# Patient Record
Sex: Female | Born: 1985 | Race: Black or African American | Hispanic: No | Marital: Single | Smoking: Current every day smoker
Health system: Southern US, Community
[De-identification: ages and names within clinical notes are randomized; demographics above are authoritative.]

## PROBLEM LIST (undated history)

## (undated) DIAGNOSIS — F1910 Other psychoactive substance abuse, uncomplicated: Secondary | ICD-10-CM

---
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
## 2006-04-24 DIAGNOSIS — F17200 Nicotine dependence, unspecified, uncomplicated: Secondary | ICD-10-CM

## 2006-05-29 ENCOUNTER — Emergency Department (HOSPITAL_COMMUNITY): Admission: EM | Admit: 2006-05-29 | Discharge: 2006-05-29 | Payer: Self-pay | Admitting: Emergency Medicine

## 2006-05-31 ENCOUNTER — Inpatient Hospital Stay (HOSPITAL_COMMUNITY): Admission: EM | Admit: 2006-05-31 | Discharge: 2006-06-01 | Payer: Self-pay | Admitting: Emergency Medicine

## 2006-06-03 ENCOUNTER — Inpatient Hospital Stay (HOSPITAL_COMMUNITY): Admission: AD | Admit: 2006-06-03 | Discharge: 2006-06-03 | Payer: Self-pay | Admitting: Obstetrics and Gynecology

## 2006-07-06 ENCOUNTER — Emergency Department (HOSPITAL_COMMUNITY): Admission: EM | Admit: 2006-07-06 | Discharge: 2006-07-06 | Payer: Self-pay | Admitting: Emergency Medicine

## 2006-07-27 ENCOUNTER — Emergency Department (HOSPITAL_COMMUNITY): Admission: EM | Admit: 2006-07-27 | Discharge: 2006-07-27 | Payer: Self-pay | Admitting: Family Medicine

## 2006-12-16 ENCOUNTER — Emergency Department (HOSPITAL_COMMUNITY): Admission: EM | Admit: 2006-12-16 | Discharge: 2006-12-16 | Payer: Self-pay | Admitting: Emergency Medicine

## 2006-12-24 ENCOUNTER — Encounter (INDEPENDENT_AMBULATORY_CARE_PROVIDER_SITE_OTHER): Payer: Self-pay | Admitting: Family Medicine

## 2007-04-17 ENCOUNTER — Emergency Department (HOSPITAL_COMMUNITY): Admission: EM | Admit: 2007-04-17 | Discharge: 2007-04-17 | Payer: Self-pay | Admitting: Family Medicine

## 2007-06-25 ENCOUNTER — Emergency Department (HOSPITAL_COMMUNITY): Admission: EM | Admit: 2007-06-25 | Discharge: 2007-06-25 | Payer: Self-pay | Admitting: Emergency Medicine

## 2008-09-12 ENCOUNTER — Emergency Department (HOSPITAL_COMMUNITY): Admission: EM | Admit: 2008-09-12 | Discharge: 2008-09-12 | Payer: Self-pay | Admitting: Emergency Medicine

## 2008-10-14 ENCOUNTER — Inpatient Hospital Stay (HOSPITAL_COMMUNITY): Admission: AD | Admit: 2008-10-14 | Discharge: 2008-10-14 | Payer: Self-pay | Admitting: Obstetrics & Gynecology

## 2008-10-26 ENCOUNTER — Ambulatory Visit: Payer: Self-pay | Admitting: Obstetrics and Gynecology

## 2008-10-26 ENCOUNTER — Ambulatory Visit (HOSPITAL_COMMUNITY): Admission: RE | Admit: 2008-10-26 | Discharge: 2008-10-26 | Payer: Self-pay | Admitting: Obstetrics & Gynecology

## 2010-06-01 LAB — POCT URINALYSIS DIP (DEVICE)
Bilirubin Urine: NEGATIVE
Nitrite: NEGATIVE
Protein, ur: NEGATIVE mg/dL
Urobilinogen, UA: 0.2 mg/dL (ref 0.0–1.0)

## 2010-06-02 LAB — CBC
HCT: 34.8 % — ABNORMAL LOW (ref 36.0–46.0)
Hemoglobin: 11.6 g/dL — ABNORMAL LOW (ref 12.0–15.0)
MCHC: 33.3 g/dL (ref 30.0–36.0)
MCV: 94.4 fL (ref 78.0–100.0)
Platelets: 359 10*3/uL (ref 150–400)
RBC: 3.69 MIL/uL — ABNORMAL LOW (ref 3.87–5.11)

## 2010-06-02 LAB — URINALYSIS, ROUTINE W REFLEX MICROSCOPIC
Bilirubin Urine: NEGATIVE
Leukocytes, UA: NEGATIVE
Nitrite: NEGATIVE
Protein, ur: NEGATIVE mg/dL
Urobilinogen, UA: 0.2 mg/dL (ref 0.0–1.0)
pH: 6 (ref 5.0–8.0)

## 2010-06-02 LAB — URINE MICROSCOPIC-ADD ON

## 2010-06-02 LAB — ABO/RH: ABO/RH(D): B POS

## 2010-06-03 LAB — RPR: RPR Ser Ql: NONREACTIVE

## 2010-06-03 LAB — POCT I-STAT, CHEM 8
HCT: 35 % — ABNORMAL LOW (ref 36.0–46.0)
Hemoglobin: 11.9 g/dL — ABNORMAL LOW (ref 12.0–15.0)
Potassium: 3.4 mEq/L — ABNORMAL LOW (ref 3.5–5.1)
Sodium: 138 mEq/L (ref 135–145)
TCO2: 26 mmol/L (ref 0–100)

## 2010-06-03 LAB — WET PREP, GENITAL
Trich, Wet Prep: NONE SEEN
Yeast Wet Prep HPF POC: NONE SEEN

## 2010-06-03 LAB — URINE MICROSCOPIC-ADD ON

## 2010-06-03 LAB — CBC
HCT: 34.2 % — ABNORMAL LOW (ref 36.0–46.0)
MCHC: 34 g/dL (ref 30.0–36.0)
MCV: 92.8 fL (ref 78.0–100.0)
Platelets: 359 10*3/uL (ref 150–400)
RBC: 3.69 MIL/uL — ABNORMAL LOW (ref 3.87–5.11)
RDW: 13.4 % (ref 11.5–15.5)
WBC: 11.2 10*3/uL — ABNORMAL HIGH (ref 4.0–10.5)

## 2010-06-03 LAB — URINALYSIS, ROUTINE W REFLEX MICROSCOPIC
Glucose, UA: NEGATIVE mg/dL
Ketones, ur: 15 mg/dL — AB
Leukocytes, UA: NEGATIVE
Nitrite: NEGATIVE
Urobilinogen, UA: 1 mg/dL (ref 0.0–1.0)
pH: 6 (ref 5.0–8.0)

## 2010-06-03 LAB — ABO/RH: ABO/RH(D): B POS

## 2010-06-03 LAB — DIFFERENTIAL
Lymphs Abs: 1.6 10*3/uL (ref 0.7–4.0)
Monocytes Relative: 9 % (ref 3–12)

## 2010-07-10 NOTE — Group Therapy Note (Signed)
NAMESOLE, LENGACHER           ACCOUNT NO.:  [id number]   MEDICAL RECORD NO.:  [id number]          PATIENT TYPE:  OUT   LOCATION:  [facility]                    [facility]   PHYSICIAN:  Elsie Lincoln, MD      DATE OF BIRTH:  1985/09/26   DATE OF SERVICE:                                  CLINIC NOTE   HISTORY OF PRESENT ILLNESS:  The patient is a 25 year old G64, P3-0-1-3  female, who was seen in the ER on October 14, 2008, with right ectopic  pregnancy that was resolving.  The patient also has chronic PID or  endometriosis.  She was scheduled today for surgery by Dr. Marice Potter and Dr.  Shawnie Pons.  The patient got confused and came to clinic, this was discovered  too late and the surgery was cancelled.  The patient was sent to clinic,  sent for ultrasound, she does still have a right TOA versus  endometrioma.  She has a left simple hydrosalpinx and some fluid in the  uterus.  She is out of pain today and her UPT is negative.  I offered  the patient surgery on September 27, but she stated that she cannot come  that day and probably never come because she cannot find a babysitter.  I told her that this up to her what she has surgery or not given that  her ectopic pregnancy has resolved, that she no longer has an emergency  to need surgery and it is up to her whether she wants surgery for pain.  The patient states she is out of all pains, so she will hold off on  surgery.  I warned her that she is at risk for ectopic pregnancy in the  future given the damage to her tubes.  She is currently on Depo-Provera.  I encouraged her to continue Depo-Provera and she is due for another  Depo shot 12 weeks after her last injection.  Her last injection was  October 14, 2008.   PAST MEDICAL HISTORY:  Denies all major medical problems.   PAST SURGICAL HISTORY:  Cesarean section.   OBSTETRICAL HISTORY:  Three cesarean sections and 1 ectopic pregnancy.   GYN HISTORY:  As above.  No history of abnormal Pap  smear.   MEDICATIONS:  None.   ALLERGIES:  None.  No latex allergy.   FAMILY HISTORY:  No diabetes, heart disease, blood clots, or familial  cancers.   SOCIAL HISTORY:  She does smoke, does not drink alcohol, does not have a  history of physical abuse or addicted to alcohol.   REVIEW OF SYSTEMS:  Positive for recent shortness of breath, but not  today.   PHYSICAL EXAMINATION:  GENERAL:  Not done today.  This is basically a  consult visit on surgery and contraception.   ASSESSMENT AND PLAN:  44. A 25 year old female, G4, para 3-0-1-3 with resolved ectopic      pregnancy with prolonged discussion on Depo-Provera.  2. Ovarian abscess and need for surgery whenever she thinks that the      pain is too much for her to handle as indicated in the above  discussion.  The patient needs to continue her routine health care      maintenance.           ______________________________  Elsie Lincoln, MD     KL/MEDQ  D:  10/26/2008  T:  10/27/2008  Job:  [id number]

## 2010-07-13 NOTE — Discharge Summary (Signed)
NAMEKAEYA, SCHIFFER           ACCOUNT NO.:  [id number]   MEDICAL RECORD NO.:  [id number]          PATIENT TYPE:  INP   LOCATION:  9108                          [facility]ILITY[facility]  [facility]   PHYSICIAN:  Tanya S. Shawnie Pons, M.D.   DATE OF BIRTH:  1985/11/26   DATE OF ADMISSION:  07/14/2004  DATE OF DISCHARGE:  07/16/2004                                 DISCHARGE SUMMARY   HOSPITAL COURSE:  This is an 24 year old G3 P3-0-0-3 presented at 12 and 2  weeks in labor. Went on to have repeat C-section with no complications. Had  previously signed for BTL but was explained to her that this could not be  performed secondary to her age. Delivery was under spinal anesthesia. Baby  boy, three-vessel cord, manual placental removal. Estimated blood loss  approximately 400 mL. Apgars 8 and 9. Mom plans to bottle feed and received  Depo at discharge and will get IUD at follow-up visit. Mom does not want  circumcision secondary to cost. Social work consult ordered for questionable  bonding issues. GBS positive on May 25, 2004. Blood type B positive,  antibody screen negative, rubella immune. Discharged home on Percocet 5/325  mg p.o. q.4h. p.r.n. pain. Follow up in 6 weeks at [facility]. Discharge  hemoglobin 8.9.       MBV/MEDQ  D:  08/10/2004  T:  08/10/2004  Job:  [id number]

## 2010-07-13 NOTE — Op Note (Signed)
   NAME:  [name], [name]                     ACCOUNT NO.:  [id number]   MEDICAL RECORD NO.:  [id number]                   PATIENT TYPE:  MAT   LOCATION:  [facility]                                 [facility]   PHYSICIAN:  Phil D. Okey Dupre, M.D.                  DATE OF BIRTH:  [birth year]   DATE OF PROCEDURE:  04/07/2002  DATE OF DISCHARGE:  03/30/2002                                 OPERATIVE REPORT   PREOPERATIVE DIAGNOSIS:  Repeat cesarean section.   POSTOPERATIVE DIAGNOSIS:  Repeat cesarean section.   PROCEDURE:  Low flap cesarean section.   SURGEON:  Javier Glazier. Okey Dupre, M.D.   ASSISTANT:  Medical student.   FINDINGS:  Small 5 pound 4 ounce female infant, Apgars 9 and 9 with a  hyperspiral umbilical cord and a somewhat adherent placenta.   DESCRIPTION OF PROCEDURE:  Under satisfactory spinal anesthesia with the  patient in the dorsal supine position, Foley catheter in the urinary  bladder, the abdomen was prepped and draped in the usual a sterile manner.  The abdomen was entered through a previous transverse incision scar removing  the keloid scar on entry.  On entering the peritoneal cavity and the  anterior surface of the uterus was opened transversely by sharp dissection  and the bladder pushed away from the lower uterine segment which was entered  by sharp and blunt dissection and from an ROT presentation the baby was  delivered.  The cord was doubly clamped and divided and the baby handed to  the pediatrician.  Sample of blood was taken from the cord and the placenta  was manually removed with some difficulty because of its adherence to the  fundus of the uterus.  The uterus was then explored and closed with a  continuous running locked 0 Vicryl on an atraumatic needle.  No bleeding was  noted.  Ovaries and tubes were found to be normal.  The fascia was closed  from either incision with a continuous alternating locked 0 Vicryl on an  atraumatic needle.  Subcutaneous bleeders were  controlled with hot cautery,  skin staples for skin edge approximation.  Dry sterile dressings applied.  Tape, instrument and sponge counts were reported as correct at the end of  the procedure.  Total blood loss 400 mL during the procedure.  The patient  tolerated the procedure well and was transferred to recovery in satisfactory  condition.                                               Phil D. Okey Dupre, M.D.    PDR/MEDQ  D:  04/07/2002  T:  04/07/2002  Job:  [id number]

## 2010-07-13 NOTE — Discharge Summary (Signed)
NAMEBRENDI, [name]                     ACCOUNT NO.:  [id number]   MEDICAL RECORD NO.:  [id number]                   PATIENT TYPE:  INP   LOCATION:  9175                                 [facility]   PHYSICIAN:  Nilda Simmer, M.D.                  DATE OF BIRTH:  Jul 20, 1985   DATE OF ADMISSION:  02/09/2002  DATE OF DISCHARGE:  02/11/2002                                 DISCHARGE SUMMARY   DATE OF BIRTH:  07-Apr-1985.   PROCEDURE:  Complete OB ultrasound.   DISCHARGE DIAGNOSES:  1. Intrauterine pregnancy at 5 and 1/7th weeks gestation.  2. Preterm contractions.  3. GBS positive status.  4. Hyperglycemia secondary to steroids.  5. Depression.  6. TH pregnancy.   DISCHARGE MEDICATIONS:  1. Prenatal vitamins one by mouth each day.  2. Lexapro 10 mg by mouth each day.   FOLLOW UP:  The patient is to call Dr. Ophelia Charter' office at [phone] for an  appointment in the upcoming one to two weeks.   HOSPITAL COURSE:  [name] is a 25 year old Gravida II, Para I, 0/0/1  initially presenting at 69 and 6/7th weeks gestation secondary to a one day  history of abdominal cramping suggestive of contractions. Upon evaluation,  the patient was found to be contracting approximately every 2-5 minutes.  However, cervical examination was without change. Wet prep, gonorrhea,  Chlamydia, GBS, and urinalysis as well as FFN testing were performed during  hospitalization. Of note, wet prep was within normal limits. GC and  Chlamydia were negative. The patient is GBS positive and the patient's FFN  was negative during hospitalization. Urinalysis was within normal limits.  The patient was admitted secondary to preterm contractions and initiated on  IV fluids, bedrest, Unasyn therapy, for presenting GBS status. Frequency of  contractions decreased significantly on hospital day three. The patient's  cervical examination had not changed and was following finger tip long high  midline. The  patient was in agreement to be placed on bedrest as an  outpatient and to follow-up closely with her primary care physician, Dr.  Jonah Blue.   HOSPITAL PROBLEM LIST:  1. Preterm contractions. The patient was admitted for three days and     received Unasyn, IV fluids, and bedrest. FFN was negative. GBS positive.     Wet prep GC Chlamydia was negative. The patient did receive beta     Methasone times two doses during her hospitalization.  2. Hyperglycemia secondary to steroid injections. The patient was covered     with sliding scale insulin during her hospitalization. I will not send     the patient home on insulin. However, will have her follow-up closely     with her primary care physician, Dr. Ophelia Charter, for a Glucola or three hour     Glucola testing and determination if she may need that modification     regimen.  3. Depression. Evaluated during  hospitalization by her primary care     physician. Lexapro therapy was initiated during hospitalization. Will     discharge the patient. The patient denies suicidal ideations throughout     hospitalization.  4. Intrauterine pregnancy at 47 and 1/7th weeks gestation. Fetal heart tones     were reassuring during hospitalization. An ultrasound was performed which     revealed a single fetus, cephalic, posterior placenta, no previa, grade I     placenta. Normal amniotic fluid at 15.5. Estimated fetal weight was 50-     75% for 31 weeks. Female sex. Cervix was 3.0 cm.   DISCHARGE LABORATORY DATA:  Urinalysis negative. Group B strep positive. GBS  positive. Wet prep revealed moderate WBC's, moderate bacteria, no trich, no  yeast, and no clue cells. GC negative. Chlamydia negative.   DISCHARGE INSTRUCTIONS:  Preterm labor precautions were reviewed in detail  with the patient. The patient will be placed on bedrest. Up only to eat and  shower. The patient is to avoid any sexual activity or anything that may  cause an orgasm.                                                 Nilda Simmer, M.D.    KS/MEDQ  D:  02/11/2002  T:  02/12/2002  Job:  [id number]   cc:   Conni Elliot, M.D.  [address]  [place]  [place] [address]  Fax: [phone]   Jonah Blue, M.D.  [facility] - [address]  [place], [place] [address]  Fax: [phone]

## 2010-07-13 NOTE — Discharge Summary (Signed)
[name], [name] NO.:  [id number]   MEDICAL RECORD NO.:  [id number]          PATIENT TYPE:  INP   LOCATION:  5735                         FACILITY:  [facility]   PHYSICIAN:  Drue Dun, M.D.       DATE OF BIRTH:  04-30-1985   DATE OF ADMISSION:  05/31/2006  DATE OF DISCHARGE:  06/01/2006                               DISCHARGE SUMMARY   PRIMARY CARE PHYSICIAN:  Henri Medal, M.D. at [facility].   NOTE:  The patient left against medical advice.   DISCHARGE DIAGNOSES:  1. Pelvic inflammatory disease with tubo-ovarian abscess.  2. Tobacco abuse.  3. Status post lower transverse cesarean section times three.   DISCHARGE MEDICATIONS:  1. Doxycycline 100 mg p.o. b.i.d. times 14 days, prescription      provided.  2. Flagyl 500 mg p.o. b.i.d. times 14 days, prescription provided.  3. Ciprofloxacin 500 mg p.o. b.i.d. times 14 days, prescription      provided.  4. Vicodin 5/500 mg 1 to 2 tablets every 4 to 6 hours as needed for      pain, dispensed #15, no refills, prescription provided.   PERTINENT LABORATORY DATA:  CBC on admission showed a white blood cell  count of 15.6, hemoglobin of 12, hematocrit of 36.3, and platelets of  345.  Differential showed 81% neutrophils and absolute neutrophil count  of 12.6.  urine pregnancy test is negative on admission.  Urinalysis and  micro were negative.  Gonorrhea and chlamydia cultures obtained on May 29, 2006 were both positive.  RPR obtained on May 29, 2006 was  nonreactive.  Wet prep obtained on May 29, 2006 showed no yeast,  Trichomonas but did show few clue cells and moderate WBCs.  Complete  metabolic panel on admission showed sodium of 138, potassium of 3.4,  chloride of 105, bicarb of 23, glucose of 78, BUN of 6, creatinine of  0.75, total bilirubin of 0.8, alkaline phosphatase of 52, AST of 11, ALT  of 11, total protein of 6.6, albumin of 3.2, and calcium of 8.8.  Lipase  was  normal on admission at 18.  Hepatitis B surface antigen was obtained  and is negative.  Hepatitis C antibody is pending at the time of  discharge.  On the morning of discharge against medical advice, the  patient's white blood cell count remained elevated at 15.6.  Basic  metabolic panel revealed normal electrolytes.  The patient continued to  be febrile prior to discharge with a T-max of 101.   BRIEF HOSPITAL COURSE:  Please see full dictated history and physical  for full details on initial presentation and workup.  In brief, this is  a 3oyo African-American female, G3, P3, 0, 0, 0, with no significant  past medical history who presents with abdominal pain and physical exam  findings consistent with pelvic inflammatory disease and dehydration.  Ultrasound revealed evidence of tubo-ovarian abscess greatest on the  left measuring 3 x 2 x 3 cm.   PROBLEM LIST:  1. Pelvic inflammatory disease and tubo-ovarian abscess:  The patient  was given 1 gm IV of ceftriaxone in the emergency department and      initiated on doxycycline and Flagyl IV.  She was experiencing      nausea and vomiting upon admission, thus antibiotics were IV and      planned to be continued IV for 24 hours given concomitant tubo-      ovarian abscess.  On the morning of discharge against medical      advice, the patient's white blood cell count remains significantly      elevated and patient continues to have fever.  Recommended that      patient remain in the hospital for continued IV antibiotics and      pain control.  The patient is unable to find care for her children      thus desires to leave against medical advice.  The risks of abscess      rupture and permanent infertility were discussed with the patient      as possible complications of leaving against medical advice.  The      patient was fully understanding of these complications and the      importance and severity of her condition.  The patient was  provided      prescriptions for doxycycline, Flagyl and ciprofloxacin orally for      a two week supply upon discharge against medical advice.  The      patient was still in significant degree of pain on the morning of      discharge as well.  2. Nausea, vomiting, and dehydration:  The patient was given 1.5 liter      normal saline IV fluid bolus in the ED.  The patient was then      placed on normal saline with 20 mEq of KCl per liter at 150 mL per      hour.  The patient was tolerating some minimal p.o. at the time of      discharge.  3. Tobacco abuse:  Smoking cessation consult was ordered however was      not obtained prior to discharge against medical advice.  4. Disposition:  The patient was discharged against medical advice      given the fact that she had no childcare for her children.  Social      Work was attempted to be contacted however the patient was not      willing to wait.  The patient was advised of the need for her      partner to be treated for gonorrhea and chlamydia as well.  The      patient was provided with prescriptions for a 14 day course of oral      doxycycline, Flagyl, and ciprofloxacin prior to discharge.  The      patient agrees to try to return to the hospital tonight if she is      able to arrange childcare for her children.  She was advised to      return to the hospital immediate if her pain is worsened or she is      unable to keep down her oral antibiotics.  The patient expressed      full understanding of the importance and severity of her condition      as well as the possible complications of discharge against medical      advice.   PROCEDURES:  The patient had pelvic ultrasound which showed concern for  possible bilateral tubo-ovarian abscess  greatest on the left measuring 3  x 2 x 3 cm.   DISCHARGE INSTRUCTIONS:  The patient has no dietary restrictions or wound care applicable.  The patient is to increase activity slowly.  The  patient is to  call the [facility] for a followup appointment  with Dr. Ludwig Clarks as soon as possible next week if she is unable to  return to the hospital.  The patient was provided with this phone  number.   The patient was discharged home against medical advice.           ______________________________  Drue Dun, M.D.     EE/MEDQ  D:  06/01/2006  T:  06/01/2006  Job:  [id number]   cc:   Henri Medal, MD

## 2010-07-13 NOTE — H&P (Signed)
NAMEMARICIA, [name]           ACCOUNT NO.:  [id number]   MEDICAL RECORD NO.:  [id number]          PATIENT TYPE:  INP   LOCATION:  5735                         FACILITY:  [facility]   PHYSICIAN:  Santiago Bumpers. Hensel, M.D.DATE OF BIRTH:  May 07, 1985   DATE OF ADMISSION:  05/31/2006  DATE OF DISCHARGE:  06/01/2006                              HISTORY & PHYSICAL   PRIMARY CARE PHYSICIAN:  Dr. Jackalyn Lombard at [facility].   CHIEF COMPLAINT:  Abdominal pain.   HISTORY OF PRESENT ILLNESS:  This is a 25 year old African-American  female G3, P 3-0-0-3 who presents with a 1-week history of progressively  worsening left-sided abdominal pain.  She describes the pain as being a  10/10 and only minimally relieved with p.o. Percocet at home.  The  patient reports that she was seen in the ED on May 29, 2006, for her  pain and at that time had a pelvic exam, had GC/Chlamydia and wet prep  obtained as well as an RPR.  These results have returned and revealed  GC/Chlamydia positive and wet prep positive for clue cells.  Since May 29, 2006, the patient's pain has continued to get worse and the patient  has vomited four times today on the day of admission.  She has not been  able to eat since last night.  She reports subjective fever and chills.  She denies any change in vaginal discharge or odor.  She currently  reports using condoms for contraception but is not using any other  medications or contraceptive devices.   REVIEW OF SYSTEMS:  The patient reports subjective fever and chills,  denies malaise, does report decreased appetite today.  CARDIOVASCULAR:  The patient reports chest pain this morning in the left upper quadrant  region starting around 9 a.m. associated with shortness of breath as it  was painful to breathe secondary to her left-sided pain.  The pain was  not radiating and not associated with nausea, vomiting or diaphoresis.  The pain resolved when the  patient arrived in the ED and received  morphine.  PULMONARY: Denies cough, sore throat or rhinorrhea.  GASTROINTESTINAL: Last vomiting was 2 days ago, reports occasional  heartburn, emesis as per HPI.  Denies diarrhea.  Emesis is nonbloody and  nonbilious.  GENITOURINARY: Denies dysuria or hematuria.  SKIN: Denies  rash.  Remainder of review of systems is unremarkable.   PAST MEDICAL HISTORY:  The patient is G3, P 3-0-0-3 status post lower  transverse cesarean section x3.  The patient has not had any other  surgeries.  The patient has a history of tobacco abuse.  The patient has  no known other medical problems.  The patient denies any previous  histories of sexually transmitted diseases or pelvic inflammatory  disease.   MEDICATIONS:  None.   ALLERGIES:  NO KNOWN DRUG ALLERGIES.   FAMILY HISTORY:  Is noncontributory.   SOCIAL HISTORY:  The patient currently lives with her mother and three  children.  Children have different fathers who are not involved.  The  patient dropped out of high school secondary to pregnancy.  The patient  smokes one pack per day but denies alcohol or drug use.  The patient  uses only condoms for contraception.   PHYSICAL EXAMINATION:  VITAL SIGNS:  Temperature is 99.5, heart rate  122, respiratory rate 22, blood pressure 106/72, pulse ox 99% on room  air.  GENERAL APPEARANCE:  The patient is awake, alert and mildly ill  appearing.  MENTAL STATUS:  Alert and oriented x3.  HEENT:  Head is normocephalic and atraumatic.  Pupils are equal, round,  reactive to light and accommodation.  Conjunctivae and lids are clear  bilaterally.  Tympanic membranes are clear bilaterally.  The patient has  moist mucous membranes status post 1.5 liters IV fluid bolus and  oropharynx is nonerythematous and without exudates.  CHEST:  Chest has symmetric movement bilaterally.  Chest pain is  reproducible on palpation of left upper quadrant and costophrenic  margin.  Lungs are  clear to auscultation bilaterally with normal work of  breathing.  Heart is regular rate and rhythm.  ABDOMEN:  Has is normoactive bowel sounds, is soft but exquisitely  tender to palpation over the suprapubic region and left upper and lower  quadrants.  No rebound or involuntary guarding.  EXTREMITIES:  No clubbing, cyanosis or edema.  Pulses are 2+ in the  dorsalis pedis pulses bilaterally.  GENITAL EXAM:  Positive cervical motion tenderness per EDP.  NEUROLOGICAL:  Cranial nerves II-XII are grossly intact.  The patient  has 5/5 strength bilaterally in all four extremities and 2+ symmetric  DTRs bilaterally.  SKIN:  Is without rash or lesion.  LYMPHATICS:  The patient has no cervical, supraclavicular or inguinal  lymphadenopathy.   LABORATORY DATA:  CBC reveals an elevated white count of 15.6,  hemoglobin 12.0, hematocrit 36.3, platelets of 345 with differential of  81% neutrophils and an absolute neutrophil count of 12.6.  Urine  pregnancy test is negative.  Urinalysis reveals specific gravity of  1.034, small bilirubin, greater than 80 ketones, 30 of protein, negative  nitrates and small leukocyte esterase; however, micro shows only 3-6  white blood cells and rare bacteria.  GC and Chlamydia cultures from  May 29, 2006, are both positive.  RPR was negative at this time.  A wet  prep from May 29, 2006, was negative for yeast or Trichomonas but  showed few clue cells and moderate white blood cells.   ASSESSMENT AND PLAN:  This is a 25 year old African-American female G3,  P 3-0-0-3 with no significant past medical history who presents with  pelvic inflammatory disease and dehydration.   Problem #1. PELVIC INFLAMMATORY DISEASE:  The patient is currently  afebrile but white blood cell count is significantly elevated and the  patient has classic signs and symptoms as well as positive gonorrhea and  Chlamydia cultures from 2 days ago.  The patient's degree of exquisite tenderness to  palpation on exam is concerning for possible tubo-ovarian  abscess.  The patient is status post ceftriaxone 1 gram IV in the ED.  We will start doxycycline 100 mg IV b.i.d. as well as Flagyl 500 mg IV  b.i.d. given presence of clue cells on wet prep and to cover anaerobes  and gram negatives.  We will continue IV antibiotics while the patient  has nausea and vomiting and is unable to tolerate p.o.  We will change  to p.o. antibiotics once tolerating.  We will also obtain pelvic  ultrasound to rule out tubo-ovarian abscess.  We will check hepatitis B  surface antigen,  hepatitis C antibody as well as HIV to complete STD  workup.  RPR was previously negative 2 days ago thus no need to repeat.  Urine pregnancy test was negative.  We will give morphine IV as needed  overnight for abdominal pain.   Problem 2. NAUSEA, VOMITING AND DEHYDRATION:  The patient is status post  1.5 liters IV fluid bolus in the ED.  We will continue normal saline at  150 mL per hour and give clear liquids p.o. and advance as tolerated.  We will check CMP given vomiting.  We will also give Phenergan 12.5 mg  IV q.6 as needed for symptomatic relief.   Problem 3. FLUIDS, ELECTROLYTES, NUTRITION/GASTROENTEROLOGY:  Per number  2.  We will start with clears as tolerated and advance to regular diet  as tolerated.  CMP pending to check electrolytes.   Problem 4. TOBACCO ABUSE:  We will write for smoking cessation consult.   Problem 5. DISPOSITION:  Pending toleration of p.o. antibiotics.  Advised patient that her partner will need treatment as well.     ______________________________  Drue Dun, M.D.    ______________________________  Santiago Bumpers. Leveda Anna, M.D.    EE/MEDQ  D:  05/31/2006  T:  06/01/2006  Job:  [id number]

## 2010-07-13 NOTE — Discharge Summary (Signed)
NAME:  [name], [name]                     ACCOUNT NO.:  [id number]   MEDICAL RECORD NO.:  [id number]                   PATIENT TYPE:  INP   LOCATION:  9115                                 [facility]   PHYSICIAN:  Jonah Blue, M.D.                DATE OF BIRTH:  [birth year]   DATE OF ADMISSION:  04/07/2002  DATE OF DISCHARGE:  04/10/2002                                 DISCHARGE SUMMARY   PRIMARY MEDICAL DOCTOR:  Jonah Blue, M.D.   DISCHARGE DIAGNOSES:  1. Status post repeat low transverse cesarean section, postoperative day #3.  2. Status post delivery of a viable female infant.   DISCHARGE MEDICATIONS:  1. Ibuprofen 600 mg p.o. q.6h. p.r.n. pain, #30 with one refill.  2. Percocet 5/325 mg one tablet p.o. q.4-6h. p.r.n. severe pain, #29 with no     refills.  3. Depo-Provera 150 mg IM every three months.  4. Paxil 20 mg p.o. daily.  5. Prenatal vitamins one p.o. daily x 6 weeks.  6. FESO4 325 mg p.o. t.i.d. x 6 weeks.   FOLLOW-UP:  The patient is to follow up at the family practice center with  Jonah Blue, M.D., in six weeks.  Her baby will follow up with Dr. Ophelia Charter  at the family practice center on Tuesday, April 13, 2002, at 1:50 p.m.   HISTORY OF PRESENT ILLNESS:  A 25 year old, G2, P1, admitted 39-2/7 weeks  for scheduled repeat C-section.  The patient was GBS positive with a history  of postpartum depression.  The patient was continued on Paxil.  She did not  want to labor.   HOSPITAL COURSE:  The patient was admitted and underwent the low transverse  C-section without difficulty under spinal anesthesia performed by Javier Glazier.  Okey Dupre, M.D.  Her postpartum course was relatively benign.  Staples were  removed prior to discharge.  The only issues arising in the postpartum  hospitalization were with regards to the patient's paternity, which can be  assessed as an outpatient as necessary and with regards with who will take  custody of this child.  At this  time, the patient and her mother will assume  custody of the child.  The patient's NCC, Kandis Fantasia, is involved and has  referred the patient to [employer].  Additionally, there is a DSS worker,  Ralene Cork,  who is involved and the patient was referred for child service coordination.  The patient also needs Medicaid and WIC for the baby and is aware how to  apply for this.  The patient will follow up with me in the office.  I will  attempt to maintain close monitoring of both the mother and baby in this  young mother of two small children.  Jonah Blue, M.D.    Milas Gain  D:  04/10/2002  T:  04/10/2002  Job:  [id number]   cc:   Jonah Blue, M.D.  Family Prac Resident - [address]  [place], [place] [address]  Fax: [phone]

## 2010-07-13 NOTE — Group Therapy Note (Signed)
NAME:  [name], [name]           ACCOUNT NO.:  [id number]   MEDICAL RECORD NO.:  [id number]          PATIENT TYPE:  INP   LOCATION:  [facility]                    [facility]   PHYSICIAN:  Argentina Donovan, MD        DATE OF BIRTH:  [birth year]   DATE OF SERVICE:  08/07/2004                                    CLINIC NOTE   CHIEF COMPLAINT:  Contraception counseling.   SUBJECTIVE:  Ms. Bruno is an 25 year old G3, P3, who delivered a baby on  Jul 14, 2004, at full term.  She has been requesting a tubal ligation and  has started to fill out her paperwork.  However, given that she is only 19,  tubal ligation is most likely not the best form of contraception for her at  this time.   We spent about 20 minutes discussing other alternatives for contraception  during the visit.  She has elected to choose an IUD.  She will return in  three weeks for placement of a [place] IUD.  At that point, she will be six  weeks postpartum.  She was counseled to use condoms in the meantime if she  did have intercourse.  She does report that she is in a stable, one-partner  relationship. She has no known history of pelvic inflammatory disease or  sexually transmitted diseases.  Thus, it appears that she is a good  candidate at this time for IUD.   She will return in three weeks for placement.  I discussed the patient with  Dr. Okey Dupre who is in agreement with the plan.       TV/MEDQ  D:  08/07/2004  T:  08/07/2004  Job:  [id number]

## 2010-07-13 NOTE — Op Note (Signed)
[name], [name]           ACCOUNT NO.:  [id number]   MEDICAL RECORD NO.:  [id number]          PATIENT TYPE:  INP   LOCATION:  9108                          [facility]   PHYSICIAN:  Phil D. Okey Dupre, M.D.     DATE OF BIRTH:  06/28/1985   DATE OF PROCEDURE:  07/14/2004  DATE OF DISCHARGE:                                 OPERATIVE REPORT   PROCEDURE:  Low transverse cesarean section.   PREOPERATIVE DIAGNOSES:  1.  Repeat cesarean section.  2.  Term pregnancy.  3.  Labor   POSTOPERATIVE DIAGNOSES:  1.  Repeat cesarean section.  2.  Term pregnancy.  3.  Labor.   SURGEON:  Dr. Okey Dupre   FIRST ASSISTANT:  Dr. Miles Costain   ANESTHESIA:  Spinal.   ESTIMATED BLOOD LOSS:  400 mL.   POSTOPERATIVE CONDITION:  Satisfactory.   SPECIMENS TO PATHOLOGY:  Placenta   The procedure went as follows:  Note, the patient had requested tubal  ligation and signed Medicaid papers for such.  However, before the procedure  we discussed this, told her that they were not valid because she was 25  years old, and that we could not and would not do her tubal ligation.  She  seemed to understand this.  Under satisfactory spinal anesthesia with the  patient in the dorsal supine position, a Foley catheter in her urinary.  The  abdomen was prepped and draped in usual sterile manner and entered through a  Pfannenstiel incision situated through the previous surgical scar 2 cm above  the symphysis pubis, extending for a total length of 16 cm.  The abdomen was  entered by layers and then entered the peritoneal cavity.  The visceral  peritoneum and the anterior surface of the uterus was opened transversely  and the bladder pushed away from the lower uterine segment and was entered  by sharp and blunt dissection from an ROA presentation.  The baby was easily  delivered, the cord doubly clamped as the upper airways were being  suctioned.  The cord sectioned.  The baby handed to pediatrician.  The  placenta  spontaneously removed.  The uterus explored and closed with a  continuous running locked 0 Vicryl in an atraumatic needle.  The area was  observed for bleeding.  None was noted.  The ovaries and tubes were both  normal, and the fascia was closed with a  continuous running 0 Vicryl in an atraumatic needle.  Subcutaneous bleeders  were controlled with hot cautery.  Skin staples used for skin edge  approximation.  Dry sterile dressing was applied.  The patient tolerated the  procedure well and was transferred to recovery room with a Foley catheter  draining clear amber urine at the end of the procedure.      PDR/MEDQ  D:  07/14/2004  T:  07/14/2004  Job:  [id number]

## 2010-07-13 NOTE — Discharge Summary (Signed)
[facility]  Patient:    [name], EXTON Visit Number: [id number] MRN: [id number]          Service Type: MED Location: 910A 9115 01 Attending Physician:  Tammi Sou Dictated by:   Ocie Doyne, M.D. Admit Date:  03/21/2001 Disc. Date: 03/21/01                             Discharge Summary  ADMISSION DIAGNOSES:          1. Term pregnancy.                               2. Active labor.  DISCHARGE DIAGNOSES:          1. Primary low-transverse cesarean section for                                  arrested descent and nonreassuring fetal                                  heart rate.                               2. Delivery at term of a viable fetal infant.  DISCHARGE MEDICATIONS:        1. Ibuprofen 600 mg p.o. q.6h. p.r.n. pain.                               2. Tylox one to two q.6h. p.r.n. pain.                               3. Prenatal vitamin one p.o. q.d. x two months.                               4. Ortho Evra patch one patch qweek to start                                  March 29, 2001.  HISTORY AND PHYSICAL:         This 25 year old G1 P0 presented at 40-1/7ths weeks gestation for a labor check.  She was having contractions which were becoming stronger occurring every two to five minutes.  Her pregnancy has been complicated by young age, late onset prenatal care starting at 35 weeks and anemia.  PHYSICAL EXAMINATION:  PELVIC:                       She was 3.0 cm, 100% effaced, -2 station.  She walked for one hour.  She was rechecked and was 4.0 cm, 100% effaced, -2 station with a bulging bag of water.  HOSPITAL COURSE:              She was admitted to labor and delivery and managed expectantly.  She spontaneously ruptured her membranes.  The fluid was clear.  During labor she had moderate variable decelerations with some possible subtle late fetal  heart rate decelerations.  The patient was positioned on her side  with oxygen and assessed carefully.  Two hours later, she had a prolonged late deceleration with a fetal heart rate in the 70-80s lasting approximately 12 minutes with a gradual return to baseline.  The fetal heart rate did increase with scalp stimulation.  On review of the strip, uterine contractions had increased to one every 1-2 minutes and hyperstimulation was felt to have exacerbated the fetal heart rate deceleration.  The strip was reviewed with Dr. Tamela Oddi.  The patient was dilated at that point 10.0 cm with a rim of cervix all the way around at +1 station.  She remained with the same examination over the next two to three hours despite pushing.  Because of arrest of dilation and nonreassuring fetal heart tones she underwent a primary low-transverse cesarean section under epidural anesthesia.  There were no complications and she delivered a viable female infant weighing 6 pounds 7 ounces with Apgars of 9 at one minute and 9 at five minutes.  Her postpartum course was unremarkable.  At the time of discharge she was eating and ambulating without difficulty and had had a bowel movement.  Her incision was clean.  The staples were removed and Steri-Strips applied.  Her hemoglobin at discharge was 9.5.  Social work saw her during hospitalization.  The patient lives with her mother and the mothers boyfriend.  She has an appointment to get [facility] but missed her previous appointment because her mother had to work and she could not get a ride.  She stated that her prenatal care started late because she did not want to tell her mother about the pregnancy so was not seen until 35 weeks. Following delivery she plans to stay with her 63 year old sister who will help her with the baby.  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  She has been referred to Child Services Coordination and her maternity care coordinator will follow her  after discharge.  She is discharged home in stable condition. Dictated by:   Ocie Doyne, M.D. Attending Physician:  Tammi Sou DD:  03/21/01 TD:  03/22/01 Job: [id number]

## 2010-07-13 NOTE — Discharge Summary (Signed)
[facility]  Patient:    [name], [name] Visit Number: [id number] MRN: [id number]          Service Type: MED Location: 910A 9115 01 Attending Physician:  Tammi Sou Dictated by:   Ocie Doyne, M.D. Admit Date:  03/21/2001 Discharge Date: 03/24/2001                             Discharge Summary  DATE OF BIRTH:                1985-05-27  ADMISSION DIAGNOSES:          1. Fever.                               2. Endometritis.  DISCHARGE DIAGNOSES:          1. Endometritis.                               2. Postoperative day six following primary                                  low transverse cesarean section, delivery of                                  a viable female.  ADMISSION HISTORY/PHYSICAL:   This 25 year old, G1, P1-0-0-1 had been discharged home from [facility] on postoperative three following a primary low transverse cesarean section for arrest of dilatation and nonreassuring fetal heart rate on March 18, 2001.  At the time of discharge, she was afebrile and had been doing well; however, following her discharge home, she developed a fever up to 102.5 and stated that she became so weak that she "almost blacked out."  She also noted some trouble voiding postoperatively but specifically denied dysuria, urgency or frequency of urination.  When examined she rated her pain at 10/10.  Temperature was 102.5. Heart rate was 118.  Respiratory rate 24.  She was crying in pain. Her abdominal incision was intact, clean and dry.  There was no erythema.  No drainage. Her fundus was firm and was very tender to palpation. Bowel sounds were normal.  LABORATORY DATA:              On admission included a white count of 12.7 and hemoglobin 9.5.  Urinalysis was significant for large hemoglobin and small leukocyte esterase, 11 to 20 white cells and red cells were noted as well as a few bacteria.  A urine culture; however, was  negative.  HOSPITAL COURSE:              She was admitted for treatment of wound infectious versus endometritis.  She received three days of Gentamicin and Clindamycin intravenously during the hospitalization.  She gradually defervesced and at the time of discharge she was afebrile.  She was eating and ambulating without difficulty and did not have any fundal tenderness on exam. She is discharged home on metronidazole 500 mg p.o. b.i.d. for ten days and levofloxacin p.o. q.d. for ten days and will continue with routine postpartum care at home.  FOLLOW-UP:  At [facility] in five weeks. Dictated by:   Ocie Doyne, M.D. Attending Physician:  Tammi Sou DD:  03/24/01 TD:  03/24/01 Job: [id number]

## 2010-11-20 LAB — CBC
RBC: 3.92
WBC: 11.2 — ABNORMAL HIGH

## 2010-11-20 LAB — BASIC METABOLIC PANEL
Calcium: 9.1
Creatinine, Ser: 0.7
GFR calc Af Amer: >60
GFR calc non Af Amer: >60

## 2010-11-20 LAB — URINALYSIS, ROUTINE W REFLEX MICROSCOPIC
Bilirubin Urine: NEGATIVE
Nitrite: NEGATIVE
Specific Gravity, Urine: 1.023
pH: 7

## 2010-11-20 LAB — POCT PREGNANCY, URINE: Operator id: 19830

## 2010-11-20 LAB — DIFFERENTIAL
Lymphs Abs: 1.2
Monocytes Relative: 1 — ABNORMAL LOW
Neutro Abs: 9.9 — ABNORMAL HIGH
Neutrophils Relative %: 89 — ABNORMAL HIGH

## 2010-12-05 LAB — URINALYSIS, ROUTINE W REFLEX MICROSCOPIC
Ketones, ur: 15 — AB
Nitrite: NEGATIVE
Specific Gravity, Urine: 1.026
pH: 5.5

## 2010-12-05 LAB — URINE MICROSCOPIC-ADD ON

## 2010-12-05 LAB — GC/CHLAMYDIA PROBE AMP, GENITAL: Chlamydia, DNA Probe: NEGATIVE

## 2010-12-05 LAB — POCT PREGNANCY, URINE: Operator id: 27011

## 2010-12-13 LAB — POCT PREGNANCY, URINE: Operator id: 235561

## 2010-12-13 LAB — POCT URINALYSIS DIP (DEVICE)
Glucose, UA: NEGATIVE
Nitrite: NEGATIVE
Operator id: 235561
Specific Gravity, Urine: 1.02
Urobilinogen, UA: 0.2

## 2010-12-13 LAB — WET PREP, GENITAL: Trich, Wet Prep: NONE SEEN

## 2010-12-13 LAB — GC/CHLAMYDIA PROBE AMP, GENITAL
Chlamydia, DNA Probe: NEGATIVE
GC Probe Amp, Genital: POSITIVE — AB

## 2011-06-08 ENCOUNTER — Emergency Department (HOSPITAL_COMMUNITY)
Admission: EM | Admit: 2011-06-08 | Discharge: 2011-06-08 | Disposition: A | Discharge status: Home / Self Care | Payer: No Typology Code available for payment source | Attending: Emergency Medicine | Admitting: Emergency Medicine

## 2011-06-08 ENCOUNTER — Emergency Department (HOSPITAL_COMMUNITY): Payer: No Typology Code available for payment source

## 2011-06-08 DIAGNOSIS — S134XXA Sprain of ligaments of cervical spine, initial encounter: Secondary | ICD-10-CM

## 2011-06-08 DIAGNOSIS — S139XXA Sprain of joints and ligaments of unspecified parts of neck, initial encounter: Secondary | ICD-10-CM | POA: Insufficient documentation

## 2011-06-08 MED ORDER — CYCLOBENZAPRINE HCL 10 MG PO TABS: 10.0000 mg | ORAL_TABLET | Freq: Two times a day (BID) | ORAL | Status: AC | PRN

## 2011-06-08 MED ORDER — IBUPROFEN 800 MG PO TABS: 800.0000 mg | ORAL_TABLET | Freq: Three times a day (TID) | ORAL | Status: AC

## 2011-06-08 MED ORDER — FENTANYL CITRATE 0.05 MG/ML IJ SOLN
50.0000 ug | Freq: Once | INTRAMUSCULAR | Status: AC
  Administered 2011-06-08: 50 ug via INTRAMUSCULAR
  Filled 2011-06-08: qty 2

## 2011-06-08 MED ORDER — ACETAMINOPHEN-CODEINE #3 300-30 MG PO TABS
1.0000 | ORAL_TABLET | Freq: Four times a day (QID) | ORAL | Status: AC | PRN
Start: 2011-06-08 — End: 2011-06-18

## 2011-06-08 MED ORDER — IBUPROFEN 800 MG PO TABS
800.0000 mg | ORAL_TABLET | Freq: Once | ORAL | Status: AC
  Administered 2011-06-08: 800 mg via ORAL
  Filled 2011-06-08: qty 1

## 2011-06-08 MED ORDER — OXYCODONE-ACETAMINOPHEN 5-325 MG PO TABS
1.0000 | ORAL_TABLET | Freq: Once | ORAL | Status: AC
  Administered 2011-06-08: 1 via ORAL
  Filled 2011-06-08: qty 1
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
## 2011-06-08 NOTE — ED Notes (Signed)
Hunt, PA at bedside. 
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
## 2011-06-08 NOTE — ED Notes (Signed)
c-collar dc'd by Alto [place], PA
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
## 2011-06-11 NOTE — ED Provider Notes (Signed)
Medical screening examination/treatment/procedure(s) were performed by non-physician practitioner and as supervising physician I was immediately available for consultation/collaboration.  Sondi Desch T Idalee Foxworthy, MD 06/11/11 1705 

## 2016-08-30 ENCOUNTER — Emergency Department (HOSPITAL_COMMUNITY): Payer: Self-pay | Admitting: Certified Registered Nurse Anesthetist

## 2016-08-30 ENCOUNTER — Emergency Department (HOSPITAL_COMMUNITY): Payer: Self-pay

## 2016-08-30 ENCOUNTER — Encounter (HOSPITAL_COMMUNITY)
Admission: EM | Disposition: A | Discharge status: Home / Self Care | Payer: Self-pay | Source: Home / Self Care | Attending: Emergency Medicine

## 2016-08-30 ENCOUNTER — Ambulatory Visit (HOSPITAL_COMMUNITY)
Admission: EM | Admit: 2016-08-30 | Discharge: 2016-08-30 | Disposition: A | Discharge status: Home / Self Care | Payer: Self-pay | Attending: Emergency Medicine | Admitting: Emergency Medicine

## 2016-08-30 ENCOUNTER — Encounter (HOSPITAL_COMMUNITY): Payer: Self-pay

## 2016-08-30 DIAGNOSIS — S6992XA Unspecified injury of left wrist, hand and finger(s), initial encounter: Secondary | ICD-10-CM

## 2016-08-30 DIAGNOSIS — F17210 Nicotine dependence, cigarettes, uncomplicated: Secondary | ICD-10-CM | POA: Insufficient documentation

## 2016-08-30 DIAGNOSIS — W230XXA Caught, crushed, jammed, or pinched between moving objects, initial encounter: Secondary | ICD-10-CM | POA: Insufficient documentation

## 2016-08-30 DIAGNOSIS — S68613A Complete traumatic transphalangeal amputation of left middle finger, initial encounter: Secondary | ICD-10-CM | POA: Insufficient documentation

## 2016-08-30 HISTORY — PX: AMPUTATION: SHX166

## 2016-08-30 LAB — CBC
HCT: 38.4 % (ref 36.0–46.0)
Hemoglobin: 13.2 g/dL (ref 12.0–15.0)
MCH: 31.6 pg (ref 26.0–34.0)
MCHC: 34.4 g/dL (ref 30.0–36.0)
MCV: 91.9 fL (ref 78.0–100.0)
PLATELETS: 317 10*3/uL (ref 150–400)
RBC: 4.18 MIL/uL (ref 3.87–5.11)
RDW: 13.1 % (ref 11.5–15.5)
WBC: 8.5 10*3/uL (ref 4.0–10.5)

## 2016-08-30 LAB — BASIC METABOLIC PANEL
ANION GAP: 10 (ref 5–15)
BUN: 13 mg/dL (ref 6–20)
CALCIUM: 9.2 mg/dL (ref 8.9–10.3)
CO2: 24 mmol/L (ref 22–32)
Chloride: 101 mmol/L (ref 101–111)
Creatinine, Ser: 0.74 mg/dL (ref 0.44–1.00)
GFR calc Af Amer: >60 mL/min (ref >60)
GLUCOSE: 67 mg/dL (ref 65–99)
POTASSIUM: 3.4 mmol/L — AB (ref 3.5–5.1)
SODIUM: 135 mmol/L (ref 135–145)

## 2016-08-30 LAB — I-STAT BETA HCG BLOOD, ED (MC, WL, AP ONLY)

## 2016-08-30 SURGERY — AMPUTATION DIGIT
Anesthesia: General | Site: Finger | Laterality: Left

## 2016-08-30 MED ORDER — LIDOCAINE HCL 1 % IJ SOLN
INTRAMUSCULAR | Status: DC | PRN
  Administered 2016-08-30: 5 mL

## 2016-08-30 MED ORDER — HYDROMORPHONE HCL 1 MG/ML IJ SOLN
1.0000 mg | Freq: Once | INTRAMUSCULAR | Status: AC
  Administered 2016-08-30: 1 mg via INTRAVENOUS
  Filled 2016-08-30: qty 1

## 2016-08-30 MED ORDER — LIDOCAINE HCL (PF) 1 % IJ SOLN
INTRAMUSCULAR | Status: AC
  Filled 2016-08-30: qty 30

## 2016-08-30 MED ORDER — SODIUM CHLORIDE 0.9 % IV SOLN
INTRAVENOUS | Status: DC
  Administered 2016-08-30 (×2): via INTRAVENOUS

## 2016-08-30 MED ORDER — OXYCODONE HCL 5 MG PO TABS: 5.0000 mg | ORAL_TABLET | Freq: Four times a day (QID) | ORAL | 0 refills | Status: DC | PRN

## 2016-08-30 MED ORDER — CEFAZOLIN SODIUM-DEXTROSE 2-4 GM/100ML-% IV SOLN
2.0000 g | INTRAVENOUS | Status: AC
  Administered 2016-08-30: 2 g via INTRAVENOUS

## 2016-08-30 MED ORDER — LACTATED RINGERS IV SOLN: INTRAVENOUS | Status: DC

## 2016-08-30 MED ORDER — FENTANYL CITRATE (PF) 100 MCG/2ML IJ SOLN
INTRAMUSCULAR | Status: DC
Start: 2016-08-30 — End: 2016-08-31
  Filled 2016-08-30: qty 2

## 2016-08-30 MED ORDER — BACITRACIN ZINC 500 UNIT/GM EX OINT
TOPICAL_OINTMENT | CUTANEOUS | Status: AC
  Filled 2016-08-30: qty 28.35

## 2016-08-30 MED ORDER — OXYCODONE HCL 5 MG PO TABS: 5.0000 mg | ORAL_TABLET | Freq: Once | ORAL | Status: DC | PRN

## 2016-08-30 MED ORDER — FENTANYL CITRATE (PF) 100 MCG/2ML IJ SOLN
25.0000 ug | INTRAMUSCULAR | Status: DC | PRN
  Administered 2016-08-30: 50 ug via INTRAVENOUS

## 2016-08-30 MED ORDER — OXYCODONE HCL 5 MG/5ML PO SOLN: 5.0000 mg | Freq: Once | ORAL | Status: DC | PRN

## 2016-08-30 MED ORDER — ONDANSETRON HCL 4 MG/2ML IJ SOLN
INTRAMUSCULAR | Status: DC | PRN
Start: 2016-08-30 — End: 2016-08-30
  Administered 2016-08-30: 4 mg via INTRAVENOUS

## 2016-08-30 MED ORDER — BUPIVACAINE-EPINEPHRINE 0.5% -1:200000 IJ SOLN
INTRAMUSCULAR | Status: DC | PRN
  Administered 2016-08-30: 5 mL

## 2016-08-30 MED ORDER — LIDOCAINE HCL (CARDIAC) 20 MG/ML IV SOLN
INTRAVENOUS | Status: AC
  Filled 2016-08-30: qty 5

## 2016-08-30 MED ORDER — FENTANYL CITRATE (PF) 250 MCG/5ML IJ SOLN
INTRAMUSCULAR | Status: AC
  Filled 2016-08-30: qty 5

## 2016-08-30 MED ORDER — BUPIVACAINE-EPINEPHRINE (PF) 0.5% -1:200000 IJ SOLN
INTRAMUSCULAR | Status: AC
  Filled 2016-08-30: qty 1.8

## 2016-08-30 MED ORDER — SODIUM CHLORIDE 0.9 % IV BOLUS (SEPSIS)
500.0000 mL | Freq: Once | INTRAVENOUS | Status: AC
  Administered 2016-08-30: 500 mL via INTRAVENOUS

## 2016-08-30 MED ORDER — MEPERIDINE HCL 25 MG/ML IJ SOLN: 6.2500 mg | INTRAMUSCULAR | Status: DC | PRN

## 2016-08-30 MED ORDER — MIDAZOLAM HCL 2 MG/2ML IJ SOLN
INTRAMUSCULAR | Status: AC
  Filled 2016-08-30: qty 2

## 2016-08-30 MED ORDER — PROPOFOL 10 MG/ML IV BOLUS
INTRAVENOUS | Status: DC | PRN
  Administered 2016-08-30 (×2): 20 mg via INTRAVENOUS

## 2016-08-30 MED ORDER — MIDAZOLAM HCL 5 MG/5ML IJ SOLN
INTRAMUSCULAR | Status: DC | PRN
  Administered 2016-08-30: 2 mg via INTRAVENOUS

## 2016-08-30 MED ORDER — ACETAMINOPHEN 325 MG PO TABS: 650.0000 mg | ORAL_TABLET | Freq: Four times a day (QID) | ORAL | Status: DC | PRN

## 2016-08-30 MED ORDER — BUPIVACAINE-EPINEPHRINE (PF) 0.5% -1:200000 IJ SOLN
INTRAMUSCULAR | Status: AC
  Filled 2016-08-30: qty 30

## 2016-08-30 MED ORDER — IBUPROFEN 200 MG PO TABS: 600.0000 mg | ORAL_TABLET | Freq: Four times a day (QID) | ORAL | Status: DC | PRN

## 2016-08-30 MED ORDER — ONDANSETRON HCL 4 MG/2ML IJ SOLN
4.0000 mg | Freq: Once | INTRAMUSCULAR | Status: AC
  Administered 2016-08-30: 4 mg via INTRAVENOUS
  Filled 2016-08-30: qty 2

## 2016-08-30 MED ORDER — CEPHALEXIN 500 MG PO CAPS: 500.0000 mg | ORAL_CAPSULE | Freq: Four times a day (QID) | ORAL | 0 refills | Status: AC

## 2016-08-30 MED ORDER — PROMETHAZINE HCL 25 MG/ML IJ SOLN: 6.2500 mg | INTRAMUSCULAR | Status: DC | PRN

## 2016-08-30 MED ORDER — PROPOFOL 10 MG/ML IV BOLUS
INTRAVENOUS | Status: AC
  Filled 2016-08-30: qty 20

## 2016-08-30 SURGICAL SUPPLY — 30 items
BANDAGE COBAN STERILE 2 (GAUZE/BANDAGES/DRESSINGS) ×2 IMPLANT
BLADE AVERAGE 25MMX9MM (BLADE)
BLADE AVERAGE 25X9 (BLADE) IMPLANT
BNDG CMPR 9X4 STRL LF SNTH (GAUZE/BANDAGES/DRESSINGS)
BNDG COHESIVE 4X5 TAN NS LF (GAUZE/BANDAGES/DRESSINGS) IMPLANT
BNDG CONFORM 2 STRL LF (GAUZE/BANDAGES/DRESSINGS) ×2 IMPLANT
BNDG CONFORM 3 STRL LF (GAUZE/BANDAGES/DRESSINGS) ×3 IMPLANT
BNDG ELASTIC 2X5.8 VLCR STR LF (GAUZE/BANDAGES/DRESSINGS) IMPLANT
BNDG ESMARK 4X9 LF (GAUZE/BANDAGES/DRESSINGS) IMPLANT
CHLORAPREP W/TINT 26ML (MISCELLANEOUS) ×3 IMPLANT
CORDS BIPOLAR (ELECTRODE) ×3 IMPLANT
DRAPE SURG 17X23 STRL (DRAPES) ×3 IMPLANT
GLOVE BIO SURGEON STRL SZ7.5 (GLOVE) ×3 IMPLANT
GLOVE BIOGEL PI IND STRL 8 (GLOVE) ×1 IMPLANT
GLOVE BIOGEL PI INDICATOR 8 (GLOVE) ×2
GOWN STRL REUS W/ TWL XL LVL3 (GOWN DISPOSABLE) ×1 IMPLANT
GOWN STRL REUS W/TWL XL LVL3 (GOWN DISPOSABLE) ×3
KIT BASIN OR (CUSTOM PROCEDURE TRAY) ×3 IMPLANT
NDL HYPO 25X1 1.5 SAFETY (NEEDLE) IMPLANT
NEEDLE HYPO 25X1 1.5 SAFETY (NEEDLE) ×3 IMPLANT
PACK ORTHO EXTREMITY (CUSTOM PROCEDURE TRAY) ×3 IMPLANT
PAD CAST 4YDX4 CTTN HI CHSV (CAST SUPPLIES) IMPLANT
PADDING CAST COTTON 4X4 STRL (CAST SUPPLIES)
PENCIL BUTTON HOLSTER BLD 10FT (ELECTRODE) IMPLANT
SUT ETHILON 4 0 PS 2 18 (SUTURE) ×2 IMPLANT
SUT MNCRL AB 4-0 PS2 18 (SUTURE) IMPLANT
SUT VICRYL RAPIDE 4/0 PS 2 (SUTURE) ×2 IMPLANT
SYR 10ML LL (SYRINGE) IMPLANT
TOWEL OR 17X24 6PK STRL BLUE (TOWEL DISPOSABLE) ×3 IMPLANT
UNDERPAD 30X30 (UNDERPADS AND DIAPERS) ×3 IMPLANT

## 2016-08-30 NOTE — Anesthesia Postprocedure Evaluation (Signed)
Anesthesia Post Note  Patient: Dione PloverSharnell L Felker  Procedure(s) Performed: Procedure(s) (LRB): REVISION OF LEFT LONG FINGER AMPUTATION (Left)     Patient location during evaluation: PACU Anesthesia Type: General Level of consciousness: awake and alert Pain management: pain level controlled Vital Signs Assessment: post-procedure vital signs reviewed and stable Respiratory status: spontaneous breathing, nonlabored ventilation, respiratory function stable and patient connected to nasal cannula oxygen Cardiovascular status: stable and blood pressure returned to baseline Anesthetic complications: no    Last Vitals:  Vitals:   08/30/16 2100 08/30/16 2115  BP: 102/67 (!) 117/96  Pulse: 92 90  Resp: (!) 22 17  Temp:  36.6 C                 Shelton SilvasKevin D Marlise Fahr
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
## 2016-08-30 NOTE — Op Note (Addendum)
PATIENT:  [name]  31 y.o. female  PRE-OPERATIVE DIAGNOSIS:  Traumatic left long fingertip amputation  POST-OPERATIVE DIAGNOSIS:  Same  PROCEDURE:  Revision amputation of left long fingertip  SURGEON: Rayvon Char. Grandville Silos, MD  PHYSICIAN ASSISTANT: None  ANESTHESIA:  digit block/ light MAC  SPECIMENS:  None  DRAINS:   None  EBL:  less than 50 mL  PREOPERATIVE INDICATIONS: This patient is a 31 y.o. female with traumatic amputation of the left long fingertip, largely degloving a large portion of the distal phalanx.  The risks benefits and alternatives were discussed with the patient preoperatively including but not limited to the risks of infection, bleeding, nerve injury, cardiopulmonary complications, the need for revision surgery, among others, and the patient verbalized understanding and consented to proceed.  OPERATIVE IMPLANTS: None  OPERATIVE PROCEDURE:  After receiving prophylactic antibiotics, the patient was escorted to the operative theatre and placed in a supine position.    A surgical "time-out" was performed during which the planned procedure, proposed operative site, and the correct patient identity were compared to the operative consent and agreement confirmed by the circulating nurse according to current facility policy.   Digital block was instilled by me, with a mixture of lidocaine and Marcaine bearing epinephrine.  The digit was then pre-scrub with Hibiclens scrub brush before being formally prepped with ChloraPrep and draped in usual sterile fashion.  A tourniquet was applied around the base of the digit with an Esmarch bandage.  The distal phalanx was excised sharply.  The jagged skin edges were excised sharply with a scalpel.  The head of the middle phalanx was further contoured with the wrong during a rasp, removing the bulb no subacute, as well as the chondral surfaces.  Once satisfied with the shape of the middle phalanx, the wound was again  copiously irrigated and the tourniquet released.  The amputation was then closed, with the longer ulnar-sided flap providing coverage, still with good fingerprint skin at the tip.  This flap was inset with 4-0 Vicryl Rapide interrupted sutures.  A light dressing was applied and she was taken to the recovery room in stable condition.  DISPOSITION: She'll be discharged home with appropriate analgesic plan antibiotics, with follow-up in 7-10 days.

## 2016-08-30 NOTE — Progress Notes (Signed)
Spoke with patient's mother, Gasper LloydDella Harrington, regarding post op transportation and post op care.  Mother stated that she would be able to stay with patient until 0600 08/31/16.  After 0600, patient's Aunt will be able to stay with her.  Plan discussed and clarified with mother.

## 2016-08-30 NOTE — ED Notes (Signed)
[facility]
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
## 2016-08-30 NOTE — Transfer of Care (Signed)
Immediate Anesthesia Transfer of Care Note  Patient: Madison PloverSharnell L Adell  Procedure(s) Performed: Procedure(s): REVISION OF LEFT LONG FINGER AMPUTATION (Left)  Patient Location: PACU  Anesthesia Type:General  Level of Consciousness: awake, alert , oriented and patient cooperative  Airway & Oxygen Therapy: Patient Spontanous Breathing  Post-op Assessment: Report given to RN and Post -op Vital signs reviewed and stable  Post vital signs: Reviewed and stable  Last Vitals:  Vitals:   08/30/16 1745 08/30/16 1815  BP: 117/79 (!) 143/104  Pulse: 92 97  Resp: 18     Last Pain:  Vitals:   08/30/16 1854  PainSc: 10-Worst pain ever         Complications: No apparent anesthesia complications

## 2016-08-30 NOTE — ED Provider Notes (Signed)
I saw and evaluated the patient, reviewed the resident's note and I agree with the findings and plan.   EKG Interpretation None       Results for orders placed or performed during the hospital encounter of 08/30/16  CBC  Result Value Ref Range   WBC 8.5 4.0 - 10.5 K/uL   RBC 4.18 3.87 - 5.11 MIL/uL   Hemoglobin 13.2 12.0 - 15.0 g/dL   HCT 16.138.4 09.636.0 - 04.546.0 %   MCV 91.9 78.0 - 100.0 fL   MCH 31.6 26.0 - 34.0 pg   MCHC 34.4 30.0 - 36.0 g/dL   RDW 40.913.1 81.111.5 - 91.415.5 %   Platelets 317 150 - 400 K/uL  I-Stat beta hCG blood, ED  Result Value Ref Range   I-stat hCG, quantitative <5.0 <5 mIU/mL   Comment 3           Dg Hand Complete Left  Result Date: 08/30/2016 CLINICAL DATA:  Finger injury with sliding glass door. Initial encounter. EXAM: LEFT HAND - COMPLETE 3+ VIEW COMPARISON:  None. FINDINGS: Traumatic amputation of the middle finger distal soft tissues. Smaller tuft fracture is present with fracture right not visualized. IMPRESSION: Traumatic amputation of middle finger soft tissues and bony tuft. Electronically Signed   By: Marnee SpringJonathon  Watts M.D.   On: 08/30/2016 18:24   Patient seen by me along with the resident. Patient with injury to left middle finger. He got slammed sliding glass door. With amputation of the distal part of the finger. Patient's tetanus is up-to-date. No other injuries. Patient is right-hand dominant. No active bleeding. Bone exposure for about a centimeter. We saline applied. Family did bring the distal fingertip in it is in a bag and on ice.  Hand surgery consultation will be required.  Vanetta MuldersZackowski, Yanis Larin, MD 08/30/16 [id number]

## 2016-08-30 NOTE — OR Nursing (Signed)
Patient finger tip that was brought from the ER was discarded per Dr. Janee Mornhompson. Witnessed by Guadalupe MapleAnita Mason RN and Jacki ConesMarge Abanto RN.

## 2016-08-30 NOTE — Anesthesia Preprocedure Evaluation (Addendum)
Anesthesia Evaluation  Patient identified by MRN, date of birth, ID band Patient awake    Reviewed: Allergy & Precautions, NPO status , Patient's Chart, lab work & pertinent test results  Airway Mallampati: II  TM Distance: >3 FB Neck ROM: Full    Dental  (+) Teeth Intact, Dental Advisory Given   Pulmonary Current Smoker,    breath sounds clear to auscultation       Cardiovascular negative cardio ROS   Rhythm:Regular Rate:Normal     Neuro/Psych negative neurological ROS  negative psych ROS   GI/Hepatic negative GI ROS, Neg liver ROS,   Endo/Other  negative endocrine ROS  Renal/GU negative Renal ROS  negative genitourinary   Musculoskeletal negative musculoskeletal ROS (+)   Abdominal   Peds negative pediatric ROS (+)  Hematology negative hematology ROS (+)   Anesthesia Other Findings   Reproductive/Obstetrics negative OB ROS                            Anesthesia Physical Anesthesia Plan  ASA: I and emergent  Anesthesia Plan: MAC   Post-op Pain Management:    Induction:   PONV Risk Score and Plan: 3 and Ondansetron, Dexamethasone, Propofol and Midazolam  Airway Management Planned: Nasal Cannula  Additional Equipment:   Intra-op Plan:   Post-operative Plan:   Informed Consent: I have reviewed the patients History and Physical, chart, labs and discussed the procedure including the risks, benefits and alternatives for the proposed anesthesia with the patient or authorized representative who has indicated his/her understanding and acceptance.   Dental advisory given  Plan Discussed with: CRNA  Anesthesia Plan Comments:        Anesthesia Quick Evaluation

## 2016-08-30 NOTE — Anesthesia Procedure Notes (Signed)
Procedure Name: MAC Date/Time: 08/30/2016 7:58 PM Performed by: Oletta Lamas Pre-anesthesia Checklist: Patient identified, Emergency Drugs available, Suction available and Patient being monitored Patient Re-evaluated:Patient Re-evaluated prior to inductionOxygen Delivery Method: Nasal cannula

## 2016-08-30 NOTE — ED Triage Notes (Signed)
Per Pt, Pt had tip of left middle finger severed off in the screen door. Pt has bleeding noted and finger in a ice bag with mother.

## 2016-08-30 NOTE — Discharge Instructions (Signed)
Discharge Instructions   You have a dressing on your finger. Move your fingers as much as possible, making a full fist and fully opening the fist. Elevate your hand to reduce pain & swelling of the digits.  Ice over the operative site may be helpful to reduce pain & swelling.  DO NOT USE HEAT. Pain medicine has been prescribed for you.  Rely upon regular dosing of tylenol and ibuprofen, using the oxycodone only as a "rescue" pain med Leave the dressing in place until you return to our office.  You may shower, but keep the bandage clean & dry.  You may drive a car when you are off of prescription pain medications and can safely control your vehicle with both hands. Our office will call you to arrange follow-up   Please call [phone] during normal business hours or [phone] after hours for any problems. Including the following:  - excessive redness of the incisions - drainage for more than 4 days - fever of more than 101.5 F  *Please note that pain medications will not be refilled after hours or on weekends.

## 2016-08-31 ENCOUNTER — Encounter (HOSPITAL_COMMUNITY): Payer: Self-pay | Admitting: Orthopedic Surgery

## 2017-01-18 ENCOUNTER — Observation Stay (HOSPITAL_COMMUNITY): Payer: Self-pay

## 2017-01-18 ENCOUNTER — Emergency Department (HOSPITAL_COMMUNITY): Payer: Self-pay

## 2017-01-18 ENCOUNTER — Inpatient Hospital Stay (HOSPITAL_COMMUNITY)
Admission: EM | Admit: 2017-01-18 | Discharge: 2017-01-22 | DRG: 757 | Disposition: A | Discharge status: Home / Self Care | Payer: Self-pay | Attending: Obstetrics and Gynecology | Admitting: Obstetrics and Gynecology

## 2017-01-18 ENCOUNTER — Encounter (HOSPITAL_COMMUNITY): Payer: Self-pay | Admitting: Emergency Medicine

## 2017-01-18 ENCOUNTER — Other Ambulatory Visit: Payer: Self-pay

## 2017-01-18 DIAGNOSIS — J180 Bronchopneumonia, unspecified organism: Secondary | ICD-10-CM | POA: Diagnosis present

## 2017-01-18 DIAGNOSIS — A5901 Trichomonal vulvovaginitis: Secondary | ICD-10-CM | POA: Diagnosis present

## 2017-01-18 DIAGNOSIS — J181 Lobar pneumonia, unspecified organism: Secondary | ICD-10-CM

## 2017-01-18 DIAGNOSIS — F17210 Nicotine dependence, cigarettes, uncomplicated: Secondary | ICD-10-CM | POA: Diagnosis present

## 2017-01-18 DIAGNOSIS — R102 Pelvic and perineal pain: Secondary | ICD-10-CM | POA: Diagnosis present

## 2017-01-18 DIAGNOSIS — J189 Pneumonia, unspecified organism: Secondary | ICD-10-CM

## 2017-01-18 DIAGNOSIS — N730 Acute parametritis and pelvic cellulitis: Principal | ICD-10-CM | POA: Diagnosis present

## 2017-01-18 DIAGNOSIS — Z23 Encounter for immunization: Secondary | ICD-10-CM

## 2017-01-18 LAB — URINALYSIS, ROUTINE W REFLEX MICROSCOPIC
Bilirubin Urine: NEGATIVE
Glucose, UA: NEGATIVE mg/dL
KETONES UR: 5 mg/dL — AB
Nitrite: NEGATIVE
PH: 5 (ref 5.0–8.0)
Protein, ur: 100 mg/dL — AB
SPECIFIC GRAVITY, URINE: 1.028 (ref 1.005–1.030)

## 2017-01-18 LAB — COMPREHENSIVE METABOLIC PANEL
ALK PHOS: 55 U/L (ref 38–126)
ALT: 15 U/L (ref 14–54)
ANION GAP: 9 (ref 5–15)
AST: 17 U/L (ref 15–41)
Albumin: 3.8 g/dL (ref 3.5–5.0)
BUN: 8 mg/dL (ref 6–20)
CALCIUM: 9.6 mg/dL (ref 8.9–10.3)
CHLORIDE: 99 mmol/L — AB (ref 101–111)
CO2: 27 mmol/L (ref 22–32)
CREATININE: 0.85 mg/dL (ref 0.44–1.00)
Glucose, Bld: 125 mg/dL — ABNORMAL HIGH (ref 65–99)
Potassium: 3.8 mmol/L (ref 3.5–5.1)
Sodium: 135 mmol/L (ref 135–145)
Total Bilirubin: 0.4 mg/dL (ref 0.3–1.2)
Total Protein: 7.7 g/dL (ref 6.5–8.1)

## 2017-01-18 LAB — CBC
HCT: 42.8 % (ref 36.0–46.0)
HEMOGLOBIN: 14.5 g/dL (ref 12.0–15.0)
MCH: 32.1 pg (ref 26.0–34.0)
MCHC: 33.9 g/dL (ref 30.0–36.0)
MCV: 94.7 fL (ref 78.0–100.0)
PLATELETS: 386 10*3/uL (ref 150–400)
RBC: 4.52 MIL/uL (ref 3.87–5.11)
RDW: 14.1 % (ref 11.5–15.5)
WBC: 16.1 10*3/uL — AB (ref 4.0–10.5)

## 2017-01-18 LAB — WET PREP, GENITAL
SPERM: NONE SEEN
Yeast Wet Prep HPF POC: NONE SEEN

## 2017-01-18 LAB — LIPASE, BLOOD: LIPASE: 27 U/L (ref 11–51)

## 2017-01-18 LAB — POC URINE PREG, ED: Preg Test, Ur: NEGATIVE

## 2017-01-18 MED ORDER — HYDROMORPHONE HCL 1 MG/ML IJ SOLN
0.5000 mg | Freq: Once | INTRAMUSCULAR | Status: AC
  Administered 2017-01-18: 0.5 mg via INTRAVENOUS
  Filled 2017-01-18: qty 1

## 2017-01-18 MED ORDER — HYDROMORPHONE HCL 1 MG/ML IJ SOLN
1.0000 mg | INTRAMUSCULAR | Status: DC | PRN
  Administered 2017-01-18 – 2017-01-21 (×7): 1 mg via INTRAVENOUS
  Filled 2017-01-18 (×7): qty 1

## 2017-01-18 MED ORDER — ONDANSETRON HCL 4 MG PO TABS: 4.0000 mg | ORAL_TABLET | Freq: Four times a day (QID) | ORAL | Status: DC | PRN

## 2017-01-18 MED ORDER — MORPHINE SULFATE (PF) 4 MG/ML IV SOLN
4.0000 mg | Freq: Once | INTRAVENOUS | Status: AC
  Administered 2017-01-18: 4 mg via INTRAVENOUS
  Filled 2017-01-18: qty 1

## 2017-01-18 MED ORDER — CEFOXITIN SODIUM 1 G IV SOLR: 1.0000 g | Freq: Two times a day (BID) | INTRAVENOUS | Status: DC

## 2017-01-18 MED ORDER — SODIUM CHLORIDE 0.9 % IV BOLUS (SEPSIS)
1000.0000 mL | Freq: Once | INTRAVENOUS | Status: AC
  Administered 2017-01-18: 1000 mL via INTRAVENOUS

## 2017-01-18 MED ORDER — POLYETHYLENE GLYCOL 3350 17 G PO PACK
17.0000 g | PACK | Freq: Every day | ORAL | Status: DC
  Administered 2017-01-19 – 2017-01-21 (×3): 17 g via ORAL
  Filled 2017-01-18 (×4): qty 1

## 2017-01-18 MED ORDER — PNEUMOCOCCAL VAC POLYVALENT 25 MCG/0.5ML IJ INJ
0.5000 mL | INJECTION | INTRAMUSCULAR | Status: AC
  Administered 2017-01-19: 0.5 mL via INTRAMUSCULAR
  Filled 2017-01-18: qty 0.5

## 2017-01-18 MED ORDER — DEXTROSE-NACL 5-0.45 % IV SOLN
INTRAVENOUS | Status: DC
Start: 2017-01-18 — End: 2017-01-22
  Administered 2017-01-18 – 2017-01-22 (×9): via INTRAVENOUS

## 2017-01-18 MED ORDER — DEXTROSE 5 % IV SOLN
1.0000 g | Freq: Two times a day (BID) | INTRAVENOUS | Status: DC
  Administered 2017-01-18 – 2017-01-22 (×8): 1 g via INTRAVENOUS
  Filled 2017-01-18 (×8): qty 1

## 2017-01-18 MED ORDER — ENOXAPARIN SODIUM 40 MG/0.4ML ~~LOC~~ SOLN: 40.0000 mg | SUBCUTANEOUS | Status: DC

## 2017-01-18 MED ORDER — DOXYCYCLINE HYCLATE 100 MG PO TABS
100.0000 mg | ORAL_TABLET | Freq: Two times a day (BID) | ORAL | Status: DC
  Administered 2017-01-18 – 2017-01-22 (×8): 100 mg via ORAL
  Filled 2017-01-18 (×8): qty 1

## 2017-01-18 MED ORDER — PRENATAL MULTIVITAMIN CH
1.0000 | ORAL_TABLET | Freq: Every day | ORAL | Status: DC
Start: 2017-01-19 — End: 2017-01-22
  Administered 2017-01-19 – 2017-01-21 (×3): 1 via ORAL
  Filled 2017-01-18 (×3): qty 1

## 2017-01-18 MED ORDER — OXYCODONE-ACETAMINOPHEN 5-325 MG PO TABS
1.0000 | ORAL_TABLET | Freq: Four times a day (QID) | ORAL | Status: DC | PRN
  Administered 2017-01-19 – 2017-01-20 (×4): 2 via ORAL
  Filled 2017-01-18 (×4): qty 2

## 2017-01-18 MED ORDER — PROMETHAZINE HCL 25 MG/ML IJ SOLN
12.5000 mg | Freq: Once | INTRAMUSCULAR | Status: AC
  Administered 2017-01-18: 12.5 mg via INTRAVENOUS
  Filled 2017-01-18: qty 1

## 2017-01-18 MED ORDER — DOXYCYCLINE HYCLATE 100 MG PO TABS
100.0000 mg | ORAL_TABLET | Freq: Once | ORAL | Status: AC
  Administered 2017-01-18: 100 mg via ORAL
  Filled 2017-01-18: qty 1

## 2017-01-18 MED ORDER — DEXTROSE 5 % IV SOLN
2.0000 g | Freq: Once | INTRAVENOUS | Status: AC
  Administered 2017-01-18: 2 g via INTRAVENOUS
  Filled 2017-01-18: qty 2

## 2017-01-18 MED ORDER — SODIUM CHLORIDE 0.9 % IV SOLN
Freq: Once | INTRAVENOUS | Status: AC
  Administered 2017-01-18: 17:00:00 via INTRAVENOUS

## 2017-01-18 MED ORDER — IOPAMIDOL (ISOVUE-300) INJECTION 61%
INTRAVENOUS | Status: AC
  Administered 2017-01-18: 100 mL
  Filled 2017-01-18: qty 100

## 2017-01-18 MED ORDER — METRONIDAZOLE 500 MG PO TABS
500.0000 mg | ORAL_TABLET | Freq: Once | ORAL | Status: AC
  Administered 2017-01-18: 500 mg via ORAL
  Filled 2017-01-18: qty 1

## 2017-01-18 MED ORDER — ONDANSETRON HCL 4 MG/2ML IJ SOLN
4.0000 mg | Freq: Once | INTRAMUSCULAR | Status: AC
  Administered 2017-01-18: 4 mg via INTRAVENOUS
  Filled 2017-01-18: qty 2

## 2017-01-18 MED ORDER — KETOROLAC TROMETHAMINE 30 MG/ML IJ SOLN
30.0000 mg | Freq: Once | INTRAMUSCULAR | Status: AC
  Administered 2017-01-18: 30 mg via INTRAVENOUS
  Filled 2017-01-18: qty 1

## 2017-01-18 MED ORDER — ONDANSETRON HCL 4 MG/2ML IJ SOLN
4.0000 mg | Freq: Four times a day (QID) | INTRAMUSCULAR | Status: DC | PRN
  Administered 2017-01-19 – 2017-01-21 (×2): 4 mg via INTRAVENOUS
  Filled 2017-01-18 (×2): qty 2

## 2017-01-18 MED ORDER — METRONIDAZOLE 500 MG PO TABS
500.0000 mg | ORAL_TABLET | Freq: Two times a day (BID) | ORAL | Status: DC
  Administered 2017-01-18 – 2017-01-22 (×8): 500 mg via ORAL
  Filled 2017-01-18 (×8): qty 1

## 2017-01-18 NOTE — H&P (Signed)
[name] is an 31 y.o. female. She is a G3 P3 last child age 31 without BC since, LMP 3 wk ago with Admission for suspected PID after [facility] for diffuse LAP x 24 hours, without fever, but acutely tender on pelvic exam with CMT and adnexal discomfort on Bimanual.  She is admitted for IV antibiotics, Cefotetan and Doxycycline. She was also Pos for Trichomonas, and is begun on Metronidazole. Last BM is 2 days ago, normal for pt, and she has urinary urgency x 1 day as well. CT negative for abscess, hydrosalpinx and appendix not abnormal.   Pertinent Gynecological History: Menses: flow is moderate  LMP 3 wk ago Bleeding:  Contraception: none DES exposure: unknown Blood transfusions:  Sexually transmitted diseases: past history: + trichomonas tonight, History of + GC in 2008 with negative POC, also neg in 2010 Previous GYN Procedures:   Last mammogram:  Date:  Last pap:  Date:  OB History: G3, P3 ages [age]   Menstrual History: Menarche age:  Patient's last menstrual period was 12/28/2016.    History reviewed. No pertinent past medical history.  Past Surgical History:  Procedure Laterality Date  . AMPUTATION Left 08/30/2016   Procedure: REVISION OF LEFT LONG FINGER AMPUTATION;  Surgeon: Mack Hookhompson, David, MD;  [facility];  Service: Orthopedics;  Laterality: Left;  . CESAREAN SECTION      No family history on file.  Social History:  reports that she has been smoking cigarettes.  She has been smoking about 1.00 pack per day. she has never used smokeless tobacco. She reports that she drinks alcohol. She reports that she does not use drugs.  Allergies: No Known Allergies  Medications Prior to Admission  Medication Sig Dispense Refill Last Dose  . acetaminophen (TYLENOL) 325 MG tablet Take 2 tablets (650 mg total) by mouth every 6 (six) hours as needed for mild pain or moderate pain. (Patient not taking: Reported on 01/18/2017)   Not Taking at Unknown time  .  ibuprofen (ADVIL) 200 MG tablet Take 3 tablets (600 mg total) by mouth every 6 (six) hours as needed for mild pain or moderate pain. (Patient not taking: Reported on 01/18/2017)   Not Taking at Unknown time  . oxyCODONE (ROXICODONE) 5 MG immediate release tablet Take 1 tablet (5 mg total) by mouth every 6 (six) hours as needed for severe pain. (Patient not taking: Reported on 01/18/2017) 28 tablet 0 Not Taking at Unknown time    Review of Systems  Respiratory: Negative.   Cardiovascular: Negative.   Gastrointestinal: Positive for constipation.  Genitourinary: Positive for frequency and urgency.  Skin: Negative.   Neurological: Negative.   Psychiatric/Behavioral: Negative.     Blood pressure 109/74, pulse 98, temperature 98.6 F (37 C), temperature source Oral, resp. rate 20, height 4\' 11"  (1.499 m), weight 110 lb (49.9 kg), last menstrual period 12/28/2016, SpO2 99 %. Physical Exam  Constitutional: She appears well-developed and well-nourished.  HENT:  Head: Normocephalic and atraumatic.  Eyes: Pupils are equal, round, and reactive to light.  Neck: Normal range of motion.  GI: Soft. There is tenderness.  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 Pain is in both LQ.  Results for orders placed or performed during the hospital encounter of 01/18/17 (from the past 24 hour(s))  Lipase, blood     Status: None   Collection Time: 01/18/17 11:31 AM  Result Value Ref Range   Lipase 27 11 - 51 U/L  Comprehensive metabolic panel     Status: Abnormal   Collection Time: 01/18/17 11:31 AM  Result Value Ref Range   Sodium 135 135  - 145 mmol/L   Potassium 3.8 3.5 - 5.1 mmol/L   Chloride 99 (L) 101 - 111 mmol/L   CO2 27 22 - 32 mmol/L   Glucose, Bld 125 (H) 65 - 99 mg/dL   BUN 8 6 - 20 mg/dL   Creatinine, Ser 9.60 0.44 - 1.00 mg/dL   Calcium 9.6 8.9 - 45.4 mg/dL   Total Protein 7.7 6.5 - 8.1 g/dL   Albumin 3.8 3.5 - 5.0 g/dL   AST 17 15 - 41 U/L   ALT 15 14 - 54 U/L   Alkaline Phosphatase 55 38 - 126 U/L   Total Bilirubin 0.4 0.3 - 1.2 mg/dL   GFR calc non Af Amer >60 >60 mL/min   GFR calc Af Amer >60 >60 mL/min   Anion gap 9 5 - 15  CBC     Status: Abnormal   Collection Time: 01/18/17 11:31 AM  Result Value Ref Range   WBC 16.1 (H) 4.0 - 10.5 K/uL   RBC 4.52 3.87 - 5.11 MIL/uL   Hemoglobin 14.5 12.0 - 15.0 g/dL   HCT 09.8 11.9 - 14.7 %   MCV 94.7 78.0 - 100.0 fL   MCH 32.1 26.0 - 34.0 pg   MCHC 33.9 30.0 - 36.0 g/dL   RDW 82.9 56.2 - 13.0 %   Platelets 386 150 - 400 K/uL  Urinalysis, Routine w reflex microscopic     Status: Abnormal   Collection Time: 01/18/17 12:03 PM  Result Value Ref Range   Color, Urine YELLOW YELLOW   APPearance HAZY (A) CLEAR   Specific Gravity, Urine 1.028 1.005 - 1.030   pH 5.0 5.0 - 8.0   Glucose, UA NEGATIVE NEGATIVE mg/dL   Hgb urine dipstick SMALL (A) NEGATIVE   Bilirubin Urine NEGATIVE NEGATIVE   Ketones, ur 5 (A) NEGATIVE mg/dL   Protein, ur 865 (A) NEGATIVE mg/dL   Nitrite NEGATIVE NEGATIVE   Leukocytes, UA SMALL (A) NEGATIVE   RBC / HPF 0-5 0 - 5 RBC/hpf   WBC, UA TOO NUMEROUS TO COUNT 0 - 5 WBC/hpf   Bacteria, UA RARE (A) NONE SEEN   Squamous Epithelial / LPF 0-5 (A) NONE SEEN   Mucus PRESENT   POC urine preg, ED     Status: None   Collection Time: 01/18/17 12:28 PM  Result Value Ref Range   Preg Test, Ur NEGATIVE NEGATIVE  Wet prep, genital     Status: Abnormal   Collection Time: 01/18/17  1:02 PM  Result Value Ref Range   Yeast Wet Prep HPF POC NONE SEEN NONE SEEN   Trich, Wet Prep PRESENT (A) NONE SEEN   Clue Cells Wet Prep HPF POC PRESENT (A) NONE  SEEN   WBC, Wet Prep HPF POC MANY (A) NONE SEEN   Sperm NONE SEEN     Dg Chest 2 View  Result Date: 01/18/2017 CLINICAL DATA:  31 year old female with abdominal pain for 2 days with cough and shortness of breath. EXAM: CHEST  2 VIEW  COMPARISON:  None. FINDINGS: The heart size and mediastinal contours are within normal limits. Both lungs are clear. Nodular opacity projecting over the right lung base is consistent with nipple shadow has seen on lateral view. The visualized skeletal structures are unremarkable. IMPRESSION: No active cardiopulmonary disease. Electronically Signed   By: Sande BrothersSerena  Chacko M.D.   On: 01/18/2017 12:51   Ct Abdomen Pelvis W Contrast  Result Date: 01/18/2017 CLINICAL DATA:  Abdominal pain for 2 days. EXAM: CT ABDOMEN AND PELVIS WITH CONTRAST TECHNIQUE: Multidetector CT imaging of the abdomen and pelvis was performed using the standard protocol following bolus administration of intravenous contrast. CONTRAST:  100 ml ISOVUE-300 IOPAMIDOL (ISOVUE-300) INJECTION 61% COMPARISON:  None. FINDINGS: Lower chest: Airspace disease in the right lower lobe has an appearance most consistent with bronchopneumonia. Left lung is clear. No pleural or pericardial effusion. Hepatobiliary: No focal liver abnormality is seen. No gallstones, gallbladder wall thickening, or biliary dilatation. Pancreas: Unremarkable. No pancreatic ductal dilatation or surrounding inflammatory changes. Spleen: Normal in size without focal abnormality. Adrenals/Urinary Tract: Adrenal glands are unremarkable. Kidneys are normal, without renal calculi, focal lesion, or hydronephrosis. Bladder is unremarkable. Stomach/Bowel: The appendix is not visualized in this patient with no oral contrast and little intra-abdominal fat. No convincing evidence of appendicitis is seen. Stomach and small bowel appear normal. Vascular/Lymphatic: No significant vascular findings are present. No enlarged abdominal or pelvic lymph nodes.  Reproductive: Uterus and bilateral adnexa are unremarkable. Other: No hernia or fluid. Musculoskeletal: Negative. IMPRESSION: Right lower lobe airspace disease consistent with pneumonia. No acute abnormality in the abdomen. Electronically Signed   By: Drusilla Kannerhomas  Dalessio M.D.   On: 01/18/2017 14:37    Assessment/Plan: Acute pelvic pain leukocytosis Trichomonas vaginitis Low likelihood of APPendicitis on CT(my highlight) Will treat as PID    Tilda BurrowJohn V Cyera Balboni 01/18/2017, 7:26 PM

## 2017-01-18 NOTE — ED Triage Notes (Addendum)
Pt c/o abd cramping -- started 2 days ago, pt crying at triage, denies vomiting, diarrhea.  Pt has moist productive cough for green sputum  Pt admits to drinking ETOH daily-- approx a fifth of liquor and 7 beers (16 ounces)

## 2017-01-18 NOTE — ED Notes (Signed)
Patient transported to CT 

## 2017-01-18 NOTE — ED Notes (Signed)
Pt returned from CT. Pt visibly uncomfortable. Pt states that the contrast caused her stomach to burn. Pt also reports vomiting over in CT.

## 2017-01-18 NOTE — ED Notes (Signed)
Pt going to xray  

## 2017-01-18 NOTE — ED Provider Notes (Signed)
[facility] Provider Note   CSN: [id number] Arrival date & time: 01/18/17  1107     History   Chief Complaint Chief Complaint  Patient presents with  . Abdominal Pain  . Cough    HPI [name] is a 31 y.o. female.  HPI [name] is a 31 y.o. female presents to emergency department complaining of abdominal pain.  Patient states her symptoms began last night while laying in bed.  She states pain is mainly in the lower abdomen.  Reports nausea, no vomiting.   No bowel movement in 4 days, not unusual for her.  Denies vaginal discharge or bleeding.  Last menstrual cycle 2 weeks ago normal.  Denies any urinary symptoms other than urgency.  Has not taken any medications prior to coming in.  Additionally complaining of productive cough with green sputum for one week..  No shortness of breath.  No chest pain.  History of C-section x3, otherwise no abdominal surgeries.  Denies any similar pain in the past.  No back pain.  History reviewed. No pertinent past medical history.  Patient Active Problem List   Diagnosis Date Noted  . TOBACCO DEPENDENCE 04/24/2006    Past Surgical History:  Procedure Laterality Date  . AMPUTATION Left 08/30/2016   Procedure: REVISION OF LEFT LONG FINGER AMPUTATION;  Surgeon: Mack Hookhompson, David, MD;  [facility];  Service: Orthopedics;  Laterality: Left;  . CESAREAN SECTION      OB History    No data available       Home Medications    Prior to Admission medications   Medication Sig Start Date End Date Taking? Authorizing Provider  acetaminophen (TYLENOL) 325 MG tablet Take 2 tablets (650 mg total) by mouth every 6 (six) hours as needed for mild pain or moderate pain. 08/30/16   Mack Hookhompson, David, MD  ibuprofen (ADVIL) 200 MG tablet Take 3 tablets (600 mg total) by mouth every 6 (six) hours as needed for mild pain or moderate pain. 08/30/16   Mack Hookhompson, David, MD  oxyCODONE (ROXICODONE) 5 MG immediate  release tablet Take 1 tablet (5 mg total) by mouth every 6 (six) hours as needed for severe pain. 08/30/16   Mack Hookhompson, David, MD    Family History No family history on file.  Social History Social History   Tobacco Use  . Smoking status: Current Every Day Smoker    Packs/day: 1.00    Types: Cigarettes  . Smokeless tobacco: Never Used  Substance Use Topics  . Alcohol use: Yes    Comment: daily-- close to a fifth and 7 beers (16 ounce)  . Drug use: No     Allergies   Patient has no known allergies.   Review of Systems Review of Systems  Constitutional: Negative for chills and fever.  Respiratory: Positive for cough. Negative for chest tightness and shortness of breath.   Cardiovascular: Negative for chest pain, palpitations and leg swelling.  Gastrointestinal: Positive for abdominal pain. Negative for constipation, diarrhea, nausea and vomiting.  Genitourinary: Positive for pelvic pain and urgency. Negative for difficulty urinating, dysuria, flank pain, frequency, hematuria, menstrual problem, vaginal bleeding, vaginal discharge and vaginal pain.  Musculoskeletal: Negative for arthralgias, myalgias, neck pain and neck stiffness.  Skin: Negative for rash.  Neurological: Negative for dizziness, weakness and headaches.  All other systems reviewed and are negative.    Physical Exam Updated Vital Signs BP 121/87 (BP Location: Right Arm)   Pulse (!) 106   Temp  97.7 F (36.5 C) (Axillary)   Resp 18   Ht 4\' 11"  (1.499 m)   Wt 49.9 kg (110 lb)   LMP 12/28/2016   SpO2 99%   BMI 22.22 kg/m   Physical Exam  Constitutional: She appears well-developed and well-nourished.  She is tearful, crying in pain  HENT:  Head: Normocephalic.  Eyes: Conjunctivae are normal.  Neck: Neck supple.  Cardiovascular: Normal rate, regular rhythm and normal heart sounds.  Pulmonary/Chest: Effort normal and breath sounds normal. No respiratory distress. She has no wheezes. She has no rales.    Abdominal: Soft. Bowel sounds are normal. She exhibits no distension. There is tenderness. There is no rebound.  Diffuse tenderness, worse tenderness in suprapubic, bilateral lower quadrants.  Guarding.  Genitourinary:  Genitourinary Comments: Normal external genitalia. Normal vaginal canal. Large yellowish white discharge. Cervix is normal, closed. Positive CMT. Positive uterine and bilateral adnexal tenderness  Musculoskeletal: She exhibits no edema.  Neurological: She is alert.  Skin: Skin is warm and dry.  Psychiatric: She has a normal mood and affect. Her behavior is normal.  Nursing note and vitals reviewed.    ED Treatments / Results  Labs (all labs ordered are listed, but only abnormal results are displayed) Labs Reviewed  WET PREP, GENITAL - Abnormal; Notable for the following components:      Result Value   Trich, Wet Prep PRESENT (*)    Clue Cells Wet Prep HPF POC PRESENT (*)    WBC, Wet Prep HPF POC MANY (*)    All other components within normal limits  COMPREHENSIVE METABOLIC PANEL - Abnormal; Notable for the following components:   Chloride 99 (*)    Glucose, Bld 125 (*)    All other components within normal limits  CBC - Abnormal; Notable for the following components:   WBC 16.1 (*)    All other components within normal limits  URINALYSIS, ROUTINE W REFLEX MICROSCOPIC - Abnormal; Notable for the following components:   APPearance HAZY (*)    Hgb urine dipstick SMALL (*)    Ketones, ur 5 (*)    Protein, ur 100 (*)    Leukocytes, UA SMALL (*)    Bacteria, UA RARE (*)    Squamous Epithelial / LPF 0-5 (*)    All other components within normal limits  LIPASE, BLOOD  POC URINE PREG, ED  GC/CHLAMYDIA PROBE AMP ([place]) NOT AT [facility]    EKG  EKG Interpretation None       Radiology Dg Chest 2 View  Result Date: 01/18/2017 CLINICAL DATA:  31 year old female with abdominal pain for 2 days with cough and shortness of breath. EXAM: CHEST  2 VIEW  COMPARISON:  None. FINDINGS: The heart size and mediastinal contours are within normal limits. Both lungs are clear. Nodular opacity projecting over the right lung base is consistent with nipple shadow has seen on lateral view. The visualized skeletal structures are unremarkable. IMPRESSION: No active cardiopulmonary disease. Electronically Signed   By: Sande Brothers M.D.   On: 01/18/2017 12:51   Ct Abdomen Pelvis W Contrast  Result Date: 01/18/2017 CLINICAL DATA:  Abdominal pain for 2 days. EXAM: CT ABDOMEN AND PELVIS WITH CONTRAST TECHNIQUE: Multidetector CT imaging of the abdomen and pelvis was performed using the standard protocol following bolus administration of intravenous contrast. CONTRAST:  100 ml ISOVUE-300 IOPAMIDOL (ISOVUE-300) INJECTION 61% COMPARISON:  None. FINDINGS: Lower chest: Airspace disease in the right lower lobe has an appearance most consistent with bronchopneumonia. Left lung is  clear. No pleural or pericardial effusion. Hepatobiliary: No focal liver abnormality is seen. No gallstones, gallbladder wall thickening, or biliary dilatation. Pancreas: Unremarkable. No pancreatic ductal dilatation or surrounding inflammatory changes. Spleen: Normal in size without focal abnormality. Adrenals/Urinary Tract: Adrenal glands are unremarkable. Kidneys are normal, without renal calculi, focal lesion, or hydronephrosis. Bladder is unremarkable. Stomach/Bowel: The appendix is not visualized in this patient with no oral contrast and little intra-abdominal fat. No convincing evidence of appendicitis is seen. Stomach and small bowel appear normal. Vascular/Lymphatic: No significant vascular findings are present. No enlarged abdominal or pelvic lymph nodes. Reproductive: Uterus and bilateral adnexa are unremarkable. Other: No hernia or fluid. Musculoskeletal: Negative. IMPRESSION: Right lower lobe airspace disease consistent with pneumonia. No acute abnormality in the abdomen. Electronically Signed    By: Drusilla Kanner M.D.   On: 01/18/2017 14:37    Procedures Procedures (including critical care time)  Medications Ordered in ED Medications  morphine 4 MG/ML injection 4 mg (not administered)  ondansetron (ZOFRAN) injection 4 mg (not administered)  sodium chloride 0.9 % bolus 1,000 mL (not administered)     Initial Impression / Assessment and Plan / ED Course  I have reviewed the triage vital signs and the nursing notes.  Pertinent labs & imaging results that were available during my care of the patient were reviewed by me and considered in my medical decision making (see chart for details).     Patient seen and examined.  Patient with acute onset of lower abdominal pain yesterday.  She is tearful, appears to be in severe pain.  Abdomen diffusely tender, worse in the lower abdomen.  She is tachycardic, otherwise normal vital signs.  Will check pregnancy test, labs.  Will perform a pelvic exam given pelvic pain.  Will proceed with imaging after all of the examination and labs.  Morphine and Zofran ordered.  IV fluids ordered.  1:09 PM Labs show elevated white blood cell count of 16.1.  Pelvic exam with large vaginal discharge, positive cervical motion tenderness, uterine and adnexal tenderness.  PID is high on differential, however this time patient is guarding on exam, and unable to rule out appendicitis or any other intra-abdominal pathology.  Will get CT abdomen pelvis for evaluation.  No improvement with 2 doses of morphine and Zofran.  Will give Phenergan and Dilaudid.  4:07 PM Patient is slightly better.  She so far received 2 L of fluids, 3 doses of pain medications, 2 doses of antiemetics.  She received cefoxitin 2 g, doxycycline and Flagyl p.o.  She continues to have pain.  She is still tachycardic despite fluids.  She is afebrile.  She does have a white count of 16.1.  Consulted OB/GYN for possible admission.  Advised to transfer to [facility]U for reevaluation.  Patient was also  noted to have pneumonia on her chest x-ray.  I believe that antibiotics for PID should cover her pneumonia as well.  Vitals:   01/18/17 1615 01/18/17 1629 01/18/17 1630 01/18/17 1700  BP: 116/80 116/80 116/85 106/76  Pulse: 93 97 95 99  Resp: (!) 24  19 (!) 22  Temp:      TempSrc:      SpO2: 99%  99% 96%  Weight:      Height:           Final Clinical Impressions(s) / ED Diagnoses   Final diagnoses:  PID (acute pelvic inflammatory disease)  Community acquired pneumonia of right lower lobe of lung [facility])    ED Discharge  Orders    None       Jaynie CrumbleKirichenko, Pattrick Bady, Cordelia Poche-C 01/19/17 2017    Marily MemosMesner, Jason, MD 01/25/17 [phone]

## 2017-01-19 ENCOUNTER — Encounter (HOSPITAL_COMMUNITY): Payer: Self-pay | Admitting: *Deleted

## 2017-01-19 DIAGNOSIS — J181 Lobar pneumonia, unspecified organism: Secondary | ICD-10-CM

## 2017-01-19 LAB — CBC WITH DIFFERENTIAL/PLATELET
BASOS ABS: 0 10*3/uL (ref 0.0–0.1)
BASOS PCT: 0 %
Eosinophils Absolute: 0 10*3/uL (ref 0.0–0.7)
Eosinophils Relative: 0 %
HEMATOCRIT: 34.9 % — AB (ref 36.0–46.0)
HEMOGLOBIN: 11.5 g/dL — AB (ref 12.0–15.0)
LYMPHS PCT: 20 %
Lymphs Abs: 2.6 10*3/uL (ref 0.7–4.0)
MCH: 31.4 pg (ref 26.0–34.0)
MCHC: 33 g/dL (ref 30.0–36.0)
MCV: 95.4 fL (ref 78.0–100.0)
MONO ABS: 0.7 10*3/uL (ref 0.1–1.0)
MONOS PCT: 5 %
NEUTROS ABS: 9.8 10*3/uL — AB (ref 1.7–7.7)
NEUTROS PCT: 75 %
Platelets: 307 10*3/uL (ref 150–400)
RBC: 3.66 MIL/uL — ABNORMAL LOW (ref 3.87–5.11)
RDW: 13.8 % (ref 11.5–15.5)
WBC: 13.2 10*3/uL — ABNORMAL HIGH (ref 4.0–10.5)

## 2017-01-19 LAB — SEDIMENTATION RATE: SED RATE: 72 mm/h — AB (ref 0–22)

## 2017-01-19 LAB — RPR: RPR: NONREACTIVE

## 2017-01-19 LAB — HIV ANTIBODY (ROUTINE TESTING W REFLEX): HIV Screen 4th Generation wRfx: NONREACTIVE

## 2017-01-19 MED ORDER — DIPHENHYDRAMINE HCL 25 MG PO CAPS
25.0000 mg | ORAL_CAPSULE | Freq: Four times a day (QID) | ORAL | Status: DC | PRN
  Administered 2017-01-19: 25 mg via ORAL
  Filled 2017-01-19: qty 1

## 2017-01-19 NOTE — Progress Notes (Signed)
Subjective: HD 2 after admission for PID , with severe Lower abd pain, no fever, + increased WBC, . And suspected left adnexal fullness. Patient reports tolerating po. Pain persists, slight improvement.    Objective: I have reviewed patient's vital signs, intake and output and labs. CBC Latest Ref Rng & Units 01/19/2017 01/18/2017 08/30/2016  WBC 4.0 - 10.5 K/uL 13.2(H) 16.1(H) 8.5  Hemoglobin 12.0 - 15.0 g/dL 11.5(L) 14.5 13.2  Hematocrit 36.0 - 46.0 % 34.9(L) 42.8 38.4  Platelets 150 - 400 K/uL 307 386 317   CT: normal appendix U/s pelvis :retroflexed uterus, normal ovaries, slight increase in CDS fluid, with visible thin membranous adhesions consistent with chronic adhesions. Left adnexal structure behind the normal left ovary consistent with small hydrosalpinx.3.7 cm max diameter, clear fluid.    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   LOS: 1 day    Tilda BurrowJohn V Mirah Nevins 01/19/2017, 9:24 AM

## 2017-01-20 LAB — GC/CHLAMYDIA PROBE AMP (~~LOC~~) NOT AT ARMC
Chlamydia: NEGATIVE
NEISSERIA GONORRHEA: POSITIVE — AB

## 2017-01-20 MED ORDER — KETOROLAC TROMETHAMINE 10 MG PO TABS
10.0000 mg | ORAL_TABLET | Freq: Four times a day (QID) | ORAL | Status: DC
  Administered 2017-01-20 – 2017-01-22 (×7): 10 mg via ORAL
  Filled 2017-01-20 (×8): qty 1

## 2017-01-20 MED ORDER — NICOTINE 21 MG/24HR TD PT24
21.0000 mg | MEDICATED_PATCH | Freq: Every day | TRANSDERMAL | Status: DC
  Administered 2017-01-20 – 2017-01-22 (×3): 21 mg via TRANSDERMAL
  Filled 2017-01-20 (×3): qty 1

## 2017-01-20 MED ORDER — KETOROLAC TROMETHAMINE 30 MG/ML IJ SOLN
30.0000 mg | Freq: Once | INTRAMUSCULAR | Status: AC
  Administered 2017-01-20: 30 mg via INTRAVENOUS
  Filled 2017-01-20: qty 1

## 2017-01-20 NOTE — Progress Notes (Signed)
Pt requesting to be disconnect from IV pole to go across the street to smoke cigarette. Nurse reinforced how pt is not allowed to leave campus to go smoke. MD called and Nicotine patched ordered for patient.

## 2017-01-20 NOTE — Progress Notes (Signed)
Subjective: Patient reports tolerating PO.  Pain still requires percocet but this causes itching  Objective: I have reviewed patient's medication and vitals  Current Facility-Administered Medications:  .  cefoTEtan (CEFOTAN) 1 g in dextrose 5 % 50 mL IVPB, 1 g, Intravenous, Q12H, Tilda BurrowFerguson, John V, MD, Stopped at 01/19/17 2205 .  dextrose 5 %-0.45 % sodium chloride infusion, , Intravenous, Continuous, Tilda BurrowFerguson, John V, MD, Last Rate: 100 mL/hr at 01/20/17 0021 .  diphenhydrAMINE (BENADRYL) capsule 25 mg, 25 mg, Oral, Q6H PRN, Constant, Peggy, MD, 25 mg at 01/19/17 1533 .  doxycycline (VIBRA-TABS) tablet 100 mg, 100 mg, Oral, Q12H, Tilda BurrowFerguson, John V, MD, 100 mg at 01/19/17 2133 .  HYDROmorphone (DILAUDID) injection 1 mg, 1 mg, Intravenous, Q3H PRN, Tilda BurrowFerguson, John V, MD, 1 mg at 01/19/17 1947 .  metroNIDAZOLE (FLAGYL) tablet 500 mg, 500 mg, Oral, Q12H, Tilda BurrowFerguson, John V, MD, 500 mg at 01/19/17 2133 .  ondansetron (ZOFRAN) tablet 4 mg, 4 mg, Oral, Q6H PRN **OR** ondansetron (ZOFRAN) injection 4 mg, 4 mg, Intravenous, Q6H PRN, Tilda BurrowFerguson, John V, MD, 4 mg at 01/19/17 1109 .  oxyCODONE-acetaminophen (PERCOCET/ROXICET) 5-325 MG per tablet 1-2 tablet, 1-2 tablet, Oral, Q6H PRN, Tilda BurrowFerguson, John V, MD, 2 tablet at 01/20/17 [phone] .  polyethylene glycol (MIRALAX / GLYCOLAX) packet 17 g, 17 g, Oral, Daily, Tilda BurrowFerguson, John V, MD, 17 g at 01/19/17 1109 .  prenatal multivitamin tablet 1 tablet, 1 tablet, Oral, Q1200, Tilda BurrowFerguson, John V, MD, 1 tablet at 01/19/17 1108 Vitals:   01/19/17 2348 01/20/17 0755  BP: 118/69 115/72  Pulse: 84 87  Resp: 16 16  Temp: 97.9 F (36.6 C) 98.8 F (37.1 C)  SpO2: 100% 99%     General: alert, cooperative and no distress GI: abnormal findings:  mild tenderness in the lower abdomen   Assessment/Plan: HD 3 on abx for PID  LOS: 2 days    Scheryl DarterJames Arnold 01/20/2017, 9:24 AM

## 2017-01-21 ENCOUNTER — Inpatient Hospital Stay (HOSPITAL_COMMUNITY): Payer: Self-pay

## 2017-01-21 DIAGNOSIS — N730 Acute parametritis and pelvic cellulitis: Principal | ICD-10-CM

## 2017-01-21 DIAGNOSIS — R102 Pelvic and perineal pain: Secondary | ICD-10-CM

## 2017-01-21 LAB — CBC
HEMATOCRIT: 38.4 % (ref 36.0–46.0)
HEMOGLOBIN: 12.7 g/dL (ref 12.0–15.0)
MCH: 31.3 pg (ref 26.0–34.0)
MCHC: 33.1 g/dL (ref 30.0–36.0)
MCV: 94.6 fL (ref 78.0–100.0)
Platelets: 406 10*3/uL — ABNORMAL HIGH (ref 150–400)
RBC: 4.06 MIL/uL (ref 3.87–5.11)
RDW: 13.3 % (ref 11.5–15.5)
WBC: 6.1 10*3/uL (ref 4.0–10.5)

## 2017-01-21 LAB — COMPREHENSIVE METABOLIC PANEL
ALBUMIN: 2.9 g/dL — AB (ref 3.5–5.0)
ALT: 13 U/L — ABNORMAL LOW (ref 14–54)
ANION GAP: 7 (ref 5–15)
AST: 19 U/L (ref 15–41)
Alkaline Phosphatase: 46 U/L (ref 38–126)
BILIRUBIN TOTAL: 0.2 mg/dL — AB (ref 0.3–1.2)
BUN: 7 mg/dL (ref 6–20)
CO2: 27 mmol/L (ref 22–32)
Calcium: 8.9 mg/dL (ref 8.9–10.3)
Chloride: 104 mmol/L (ref 101–111)
Creatinine, Ser: 0.76 mg/dL (ref 0.44–1.00)
GFR calc Af Amer: >60 mL/min (ref >60)
GFR calc non Af Amer: >60 mL/min (ref >60)
GLUCOSE: 110 mg/dL — AB (ref 65–99)
POTASSIUM: 3.9 mmol/L (ref 3.5–5.1)
SODIUM: 138 mmol/L (ref 135–145)
TOTAL PROTEIN: 6.4 g/dL — AB (ref 6.5–8.1)

## 2017-01-21 NOTE — Progress Notes (Signed)
Subjective: Patient reports nausea.  Low abdominal pain. Cough is improved  Objective: I have reviewed patient's vital signs, medications, microbiology and radiology results.  General: alert, cooperative and mild distress Resp: clear to auscultation bilaterally GI: mild lower quadrant tenderness Extremities: extremities normal, atraumatic, no cyanosis or edema CBC    Component Value Date/Time   WBC 6.1 01/21/2017 0937   RBC 4.06 01/21/2017 0937   HGB 12.7 01/21/2017 0937   HCT 38.4 01/21/2017 0937   PLT 406 (H) 01/21/2017 0937   MCV 94.6 01/21/2017 0937   MCH 31.3 01/21/2017 0937   MCHC 33.1 01/21/2017 0937   RDW 13.3 01/21/2017 0937   LYMPHSABS 2.6 01/19/2017 0553   MONOABS 0.7 01/19/2017 0553   EOSABS 0.0 01/19/2017 0553   BASOSABS 0.0 01/19/2017 0553   CLINICAL DATA:  Pneumonia.  Vomiting.  EXAM: CHEST  2 VIEW  COMPARISON:  01/18/2017  FINDINGS: Heart size is normal. No pleural effusion or edema. Linear platelike opacities within both lower lobes identified compatible with atelectasis. No airspace consolidation identified. The visualized osseous structures are unremarkable.  IMPRESSION: 1. Linear bibasilar atelectasis.   Electronically Signed   By: Signa Kellaylor  Stroud M.D.   On: 01/21/2017 09:53    Assessment/Plan: CXR repeated due to CT finding of RLQ pneumonia, only ATX seen. GC positive. Evaluated abdomen in am for possible discharge if exam improved   LOS: 3 days    Scheryl DarterJames Zamariya Neal 01/21/2017, 11:38 AM

## 2017-01-22 DIAGNOSIS — A5901 Trichomonal vulvovaginitis: Secondary | ICD-10-CM

## 2017-01-22 MED ORDER — IBUPROFEN 600 MG PO TABS
600.0000 mg | ORAL_TABLET | Freq: Four times a day (QID) | ORAL | Status: DC | PRN
Start: 2017-01-22 — End: 2017-01-22

## 2017-01-22 MED ORDER — METRONIDAZOLE 500 MG PO TABS: 500.0000 mg | ORAL_TABLET | Freq: Two times a day (BID) | ORAL | 0 refills | Status: DC

## 2017-01-22 MED ORDER — IBUPROFEN 600 MG PO TABS: 600.0000 mg | ORAL_TABLET | Freq: Four times a day (QID) | ORAL | 0 refills | Status: DC | PRN

## 2017-01-22 MED ORDER — DOXYCYCLINE HYCLATE 100 MG PO TABS: 100.0000 mg | ORAL_TABLET | Freq: Two times a day (BID) | ORAL | 0 refills | Status: DC

## 2017-01-22 NOTE — Discharge Summary (Signed)
Physician Discharge Summary  Patient ID: Madison PloverSharnell L Egle MRN: [id number] DOB/AGE: 30/08/1985 30 y.o.  Admit date: 01/18/2017 Discharge date: 01/22/2017  Admission Diagnoses: Patient Active Problem List   Diagnosis Date Noted  . Pelvic pain in female 01/18/2017  . Trichomonas vaginitis 01/18/2017  . PID (acute pelvic inflammatory disease) 01/18/2017  . TOBACCO DEPENDENCE 04/24/2006     Discharge Diagnoses:  Active Problems:   Pelvic pain in female   Trichomonas vaginitis   PID (acute pelvic inflammatory disease)   Discharged Condition: good  Hospital Course: [name] is an 31 y.o. female. She is a G3 P3 last child age 31 without BC since, LMP 3 wk ago with Admission for suspected PID [facility] for diffuse LAP x 24 hours, without fever, but acutely tender on pelvic exam with CMT and adnexal discomfort on Bimanual.  She is admitted for IV antibiotics, Cefotetan and Doxycycline. She was also Pos for Trichomonas, and is begun on Metronidazole. Last BM is 2 days ago, normal for pt, and she has urinary urgency x 1 day as well. CT negative for abscess, hydrosalpinx and appendix not abnormal.   Pertinent Gynecological History: Menses: flow is moderate  LMP 3 wk ago Bleeding:  Contraception: none DES exposure: unknown Blood transfusions:  Sexually transmitted diseases: past history: + trichomonas tonight, History of + GC in 2008 with negative POC, also neg in 2010 Previous GYN Procedures:   Last mammogram:  Date:  Last pap:  Date:  OB History: G3, P3 ages 16-12            Menstrual History: Menarche age:  Patient's last menstrual period was 12/28/2016.  History reviewed. No pertinent past medical history.       Past Surgical History:  Procedure Laterality Date  . AMPUTATION Left 08/30/2016   Procedure: REVISION OF LEFT LONG FINGER AMPUTATION;  Surgeon: Mack Hookhompson, David, MD;  [facility];  Service: Orthopedics;  Laterality: Left;  .  CESAREAN SECTION      No family history on file.  Social History:  reports that she has been smoking cigarettes.  She has been smoking about 1.00 pack per day. she has never used smokeless tobacco. She reports that she drinks alcohol. She reports that she does not use drugs.  Allergies: No Known Allergies         Medications Prior to Admission  Medication Sig Dispense Refill Last Dose  . acetaminophen (TYLENOL) 325 MG tablet Take 2 tablets (650 mg total) by mouth every 6 (six) hours as needed for mild pain or moderate pain. (Patient not taking: Reported on 01/18/2017)   Not Taking at Unknown time  . ibuprofen (ADVIL) 200 MG tablet Take 3 tablets (600 mg total) by mouth every 6 (six) hours as needed for mild pain or moderate pain. (Patient not taking: Reported on 01/18/2017)   Not Taking at Unknown time  . oxyCODONE (ROXICODONE) 5 MG immediate release tablet Take 1 tablet (5 mg total) by mouth every 6 (six) hours as needed for severe pain. (Patient not taking: Reported on 01/18/2017) 28 tablet 0 Not Taking at Unknown time    Review of Systems  Respiratory: Negative.   Cardiovascular: Negative.   Gastrointestinal: Positive for constipation.  Genitourinary: Positive for frequency and urgency.  Skin: Negative.   Neurological: Negative.   Psychiatric/Behavioral: Negative.     Blood pressure 109/74, pulse 98, temperature 98.6 F (37 C), temperature source Oral, resp. rate 20, height 4\' 11"  (1.499 m), weight 110  lb (49.9 kg), last menstrual period 12/28/2016, SpO2 99 %. Physical Exam  Constitutional: She appears well-developed and well-nourished.  HENT:  Head: Normocephalic and atraumatic.  Eyes: Pupils are equal, round, and reactive to light.  Neck: Normal range of motion.  GI: Soft. There is tenderness.  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 Pain is in both LQ.  LabResultsLast24Hours  Results for orders placed or performed during the hospital encounter of 01/18/17 (from the past 24 hour(s))  Lipase, blood     Status: None   Collection Time: 01/18/17 11:31 AM  Result Value Ref Range   Lipase 27 11 - 51 U/L  Comprehensive metabolic panel     Status: Abnormal   Collection Time: 01/18/17 11:31 AM  Result Value Ref Range   Sodium 135 135 - 145 mmol/L   Potassium 3.8 3.5 - 5.1 mmol/L   Chloride 99 (L) 101 - 111 mmol/L   CO2 27 22 - 32 mmol/L   Glucose, Bld 125 (H) 65 - 99 mg/dL   BUN 8 6 - 20 mg/dL   Creatinine, Ser 1.61 0.44 - 1.00 mg/dL   Calcium 9.6 8.9 - 09.6 mg/dL   Total Protein 7.7 6.5 - 8.1 g/dL   Albumin 3.8 3.5 - 5.0 g/dL   AST 17 15 - 41 U/L   ALT 15 14 - 54 U/L   Alkaline Phosphatase 55 38 - 126 U/L   Total Bilirubin 0.4 0.3 - 1.2 mg/dL   GFR calc non Af Amer >60 >60 mL/min   GFR calc Af Amer >60 >60 mL/min   Anion gap 9 5 - 15  CBC     Status: Abnormal   Collection Time: 01/18/17 11:31 AM  Result Value Ref Range   WBC 16.1 (H) 4.0 - 10.5 K/uL   RBC 4.52 3.87 - 5.11 MIL/uL   Hemoglobin 14.5 12.0 - 15.0 g/dL   HCT 04.5 40.9 - 81.1 %   MCV 94.7 78.0 - 100.0 fL   MCH 32.1 26.0 - 34.0 pg   MCHC 33.9 30.0 - 36.0 g/dL   RDW 91.4 78.2 - 95.6 %   Platelets 386 150 - 400 K/uL  Urinalysis, Routine w reflex microscopic     Status: Abnormal   Collection Time: 01/18/17 12:03 PM  Result Value Ref Range   Color, Urine YELLOW YELLOW   APPearance HAZY (A) CLEAR   Specific Gravity, Urine 1.028 1.005 - 1.030   pH 5.0 5.0 - 8.0   Glucose, UA NEGATIVE NEGATIVE mg/dL   Hgb urine dipstick SMALL (A) NEGATIVE   Bilirubin Urine  NEGATIVE NEGATIVE   Ketones, ur 5 (A) NEGATIVE mg/dL   Protein, ur 213 (A) NEGATIVE mg/dL   Nitrite NEGATIVE NEGATIVE   Leukocytes, UA SMALL (A) NEGATIVE   RBC / HPF 0-5 0 - 5 RBC/hpf   WBC, UA TOO NUMEROUS TO COUNT 0 - 5 WBC/hpf   Bacteria, UA RARE (A) NONE SEEN   Squamous Epithelial / LPF 0-5 (A) NONE SEEN   Mucus PRESENT   POC urine preg, ED     Status: None   Collection Time: 01/18/17 12:28 PM  Result Value  Ref Range   Preg Test, Ur NEGATIVE NEGATIVE  Wet prep, genital     Status: Abnormal   Collection Time: 01/18/17  1:02 PM  Result Value Ref Range   Yeast Wet Prep HPF POC NONE SEEN NONE SEEN   Trich, Wet Prep PRESENT (A) NONE SEEN   Clue Cells Wet Prep HPF POC PRESENT (A) NONE SEEN   WBC, Wet Prep HPF POC MANY (A) NONE SEEN   Sperm NONE SEEN        ImagingResults(Last48hours)  Dg Chest 2 View  Result Date: 01/18/2017 CLINICAL DATA:  31 year old female with abdominal pain for 2 days with cough and shortness of breath. EXAM: CHEST  2 VIEW COMPARISON:  None. FINDINGS: The heart size and mediastinal contours are within normal limits. Both lungs are clear. Nodular opacity projecting over the right lung base is consistent with nipple shadow has seen on lateral view. The visualized skeletal structures are unremarkable. IMPRESSION: No active cardiopulmonary disease. Electronically Signed   By: Sande BrothersSerena  Chacko M.D.   On: 01/18/2017 12:51   Ct Abdomen Pelvis W Contrast  Result Date: 01/18/2017 CLINICAL DATA:  Abdominal pain for 2 days. EXAM: CT ABDOMEN AND PELVIS WITH CONTRAST TECHNIQUE: Multidetector CT imaging of the abdomen and pelvis was performed using the standard protocol following bolus administration of intravenous contrast. CONTRAST:  100 ml ISOVUE-300 IOPAMIDOL (ISOVUE-300) INJECTION 61% COMPARISON:  None. FINDINGS: Lower chest: Airspace disease in the right lower lobe has an appearance most consistent with bronchopneumonia. Left lung is clear. No  pleural or pericardial effusion. Hepatobiliary: No focal liver abnormality is seen. No gallstones, gallbladder wall thickening, or biliary dilatation. Pancreas: Unremarkable. No pancreatic ductal dilatation or surrounding inflammatory changes. Spleen: Normal in size without focal abnormality. Adrenals/Urinary Tract: Adrenal glands are unremarkable. Kidneys are normal, without renal calculi, focal lesion, or hydronephrosis. Bladder is unremarkable. Stomach/Bowel: The appendix is not visualized in this patient with no oral contrast and little intra-abdominal fat. No convincing evidence of appendicitis is seen. Stomach and small bowel appear normal. Vascular/Lymphatic: No significant vascular findings are present. No enlarged abdominal or pelvic lymph nodes. Reproductive: Uterus and bilateral adnexa are unremarkable. Other: No hernia or fluid. Musculoskeletal: Negative. IMPRESSION: Right lower lobe airspace disease consistent with pneumonia. No acute abnormality in the abdomen. Electronically Signed   By: Drusilla Kannerhomas  Dalessio M.D.   On: 01/18/2017 14:37     Assessment/Plan: Acute pelvic pain leukocytosis Trichomonas vaginitis Low likelihood of APPendicitis on CT(my highlight) Will treat as PID    Tilda BurrowJohn V Ferguson 01/18/2017, 7:26 PM F/U chest x-ray showed no pneumonia. She remained tender but she had no fever during hospitalization until the day of discharge when she felt much better and was not tender. GC was positive and she was made aware to notify sexual contacts.    Consults: None  Significant Diagnostic Studies: labs:  CBC    Component Value Date/Time   WBC 6.1 01/21/2017 0937   RBC 4.06 01/21/2017 0937   HGB 12.7 01/21/2017 0937   HCT 38.4 01/21/2017 0937   PLT 406 (H) 01/21/2017 0937   MCV 94.6 01/21/2017 0937   MCH 31.3 01/21/2017 0937   MCHC 33.1 01/21/2017 0937   RDW 13.3 01/21/2017 0937   LYMPHSABS 2.6 01/19/2017 0553   MONOABS 0.7 01/19/2017 0553   EOSABS 0.0 01/19/2017  0553   BASOSABS 0.0 01/19/2017 0553   , microbiology: gonorrhea positive and radiology: CT scan: normal pelvis, possible pneumonia  Treatments: IV hydration, antibiotics: metronidazole and cefotetan and analgesia: Dilaudid  and percocet  Discharge Exam: Blood pressure 125/81, pulse 85, temperature 98.3 F (36.8 C), temperature source Oral, resp. rate 18, height 4\' 11"  (1.499 m), weight 46.3 kg (102 lb 0.8 oz), last menstrual period 12/28/2016, SpO2 100 %. General appearance: alert, cooperative and no distress Resp: clear to auscultation bilaterally GI: soft, non-tender; bowel sounds normal; no masses,  no organomegaly  Disposition: 01-Home or Self Care    Allergies as of 01/22/2017   No Known Allergies     Medication List    STOP taking these medications   acetaminophen 325 MG tablet Commonly known as:  TYLENOL   oxyCODONE 5 MG immediate release tablet Commonly known as:  ROXICODONE     TAKE these medications   doxycycline 100 MG tablet Commonly known as:  VIBRA-TABS Take 1 tablet (100 mg total) by mouth every 12 (twelve) hours.   ibuprofen 600 MG tablet Commonly known as:  ADVIL,MOTRIN Take 1 tablet (600 mg total) by mouth every 6 (six) hours as needed for fever or headache. What changed:    medication strength  reasons to take this   metroNIDAZOLE 500 MG tablet Commonly known as:  FLAGYL Take 1 tablet (500 mg total) by mouth every 12 (twelve) hours.        Follow-up Information    [facility] up in 2 week(s).   Specialty:  Obstetrics and Gynecology Contact information: [address] [place] [address] [phone]          Signed: Scheryl Darter 01/22/2017, 9:57 AM

## 2017-01-22 NOTE — Plan of Care (Signed)
Patient resting well. Tolerates foods and beverages without nausea. IV fluid and antibiotic medications continue. Patient without complaints. K-pad used with some relief reported.

## 2017-02-11 NOTE — ED Notes (Signed)
Called provided phone number, and message left for a return call to discuss STD cultures of 01/18/2017

## 2017-02-13 NOTE — ED Notes (Signed)
02/13/2017, Called an left a message on voicemail for a return call to discuss STD results

## 2017-02-21 NOTE — ED Notes (Signed)
Called the listed phone number, message left for a return call.  02/21/2017 15:42

## 2018-11-17 ENCOUNTER — Encounter (HOSPITAL_COMMUNITY): Payer: Self-pay | Admitting: Emergency Medicine

## 2018-11-17 ENCOUNTER — Emergency Department (HOSPITAL_COMMUNITY): Payer: Self-pay

## 2018-11-17 ENCOUNTER — Other Ambulatory Visit: Payer: Self-pay

## 2018-11-17 ENCOUNTER — Emergency Department (HOSPITAL_COMMUNITY)
Admission: EM | Admit: 2018-11-17 | Discharge: 2018-11-17 | Disposition: A | Discharge status: Home / Self Care | Payer: Self-pay | Attending: Emergency Medicine | Admitting: Emergency Medicine

## 2018-11-17 DIAGNOSIS — F17210 Nicotine dependence, cigarettes, uncomplicated: Secondary | ICD-10-CM | POA: Insufficient documentation

## 2018-11-17 DIAGNOSIS — J069 Acute upper respiratory infection, unspecified: Secondary | ICD-10-CM | POA: Insufficient documentation

## 2018-11-17 DIAGNOSIS — Z20822 Contact with and (suspected) exposure to covid-19: Secondary | ICD-10-CM

## 2018-11-17 MED ORDER — BENZONATATE 100 MG PO CAPS: 100.0000 mg | ORAL_CAPSULE | Freq: Three times a day (TID) | ORAL | 0 refills | Status: AC

## 2018-11-17 MED ORDER — ALBUTEROL SULFATE HFA 108 (90 BASE) MCG/ACT IN AERS: 1.0000 | INHALATION_SPRAY | Freq: Four times a day (QID) | RESPIRATORY_TRACT | 0 refills | Status: DC | PRN

## 2018-11-17 NOTE — Discharge Instructions (Signed)
You were give an albuterol inhaler for your symptoms. Please use two puff of the inhaler every 4-6 hours as needed for shortness of breath or cough. You were also given cough medication. Take as directed. Rotate tylenol and motrin to treat your fevers and body aches. Stay well hydrated.  ° °Today you were were tested for the coronavirus.  The results will be available in the next 3 to 5 days.  If the results are positive the hospital will contact you.  If they are negative the hospital would not contact you.  You will need to self quarantine until you are aware of your results.  If they are positive you will need to self quarantine as directed below. ° °You should be isolated for at least 7 days since the onset of your symptoms AND >72 hours after symptoms resolution (absence of fever without the use of fever reducing medication and improvement in respiratory symptoms), whichever is longer ° °Please follow up with your primary care provider within 5-7 days for re-evaluation of your symptoms. If you do not have a primary care provider, information for a healthcare clinic has been provided for you to make arrangements for follow up care. Please return to the emergency department for any new or worsening symptoms. ° °

## 2018-11-17 NOTE — ED Triage Notes (Addendum)
Patient here from home with complaints of generalized body aches, cough, and flu like symptoms x3 days. Denies fever.

## 2018-11-17 NOTE — ED Provider Notes (Addendum)
[facility]-EMERGENCY DEPT Provider Note   CSN: [id number] Arrival date & time: 11/17/18  0636     History   Chief Complaint Chief Complaint  Patient presents with  . Generalized Body Aches  . Cough    HPI [name] is a 33 y.o. female.     HPI  Patient is a 33 year old female who presents the emergency department today for evaluation of rhinorrhea, congestion, productive cough, generalized body aches for the last 3 days.  She has not taken her temperature at home and does not know if she has had a fever.  She is had some shortness of breath but denies any chest pain.  Denies any abdominal pain nausea vomiting or diarrhea.  Denies any urinary symptoms.  She denies any known coronavirus exposures.  History reviewed. No pertinent past medical history.  Patient Active Problem List   Diagnosis Date Noted  . Pelvic pain in female 01/18/2017  . Trichomonas vaginitis 01/18/2017  . PID (acute pelvic inflammatory disease) 01/18/2017  . TOBACCO DEPENDENCE 04/24/2006    Past Surgical History:  Procedure Laterality Date  . AMPUTATION Left 08/30/2016   Procedure: REVISION OF LEFT LONG FINGER AMPUTATION;  Surgeon: Mack Hook, MD;  Location: [facility] OR;  Service: Orthopedics;  Laterality: Left;  . CESAREAN SECTION       OB History   No obstetric history on file.      Home Medications    Prior to Admission medications   Medication Sig Start Date End Date Taking? Authorizing Provider  albuterol (VENTOLIN HFA) 108 (90 Base) MCG/ACT inhaler Inhale 1-2 puffs into the lungs every 6 (six) hours as needed for wheezing or shortness of breath. 11/17/18   Julicia Krieger S, PA-C  benzonatate (TESSALON) 100 MG capsule Take 1 capsule (100 mg total) by mouth every 8 (eight) hours for 5 days. 11/17/18 11/22/18  Shizue Kaseman S, PA-C  doxycycline (VIBRA-TABS) 100 MG tablet Take 1 tablet (100 mg total) by mouth every 12 (twelve) hours. Patient not taking: Reported on  11/17/2018 01/22/17   Adam Phenix, MD  ibuprofen (ADVIL,MOTRIN) 600 MG tablet Take 1 tablet (600 mg total) by mouth every 6 (six) hours as needed for fever or headache. Patient not taking: Reported on 11/17/2018 01/22/17   Adam Phenix, MD  metroNIDAZOLE (FLAGYL) 500 MG tablet Take 1 tablet (500 mg total) by mouth every 12 (twelve) hours. Patient not taking: Reported on 11/17/2018 01/22/17   Adam Phenix, MD    Family History No family history on file.  Social History Social History   Tobacco Use  . Smoking status: Current Every Day Smoker    Packs/day: 1.00    Types: Cigarettes  . Smokeless tobacco: Never Used  Substance Use Topics  . Alcohol use: Yes    Comment: daily-- close to a fifth and 7 beers (16 ounce)  . Drug use: No     Allergies   Patient has no known allergies.   Review of Systems Review of Systems  Constitutional: Positive for chills and diaphoresis.  HENT: Positive for congestion and rhinorrhea. Negative for ear pain and sore throat.   Eyes: Negative for visual disturbance.  Respiratory: Positive for cough and shortness of breath.   Cardiovascular: Negative for chest pain.  Gastrointestinal: Negative for abdominal pain, constipation, diarrhea, nausea and vomiting.  Genitourinary: Negative for dysuria and hematuria.  Musculoskeletal: Positive for myalgias.  Skin: Negative for rash.  Neurological: Negative for headaches.  All other systems reviewed and  are negative.    Physical Exam Updated Vital Signs BP 125/89 (BP Location: Right Arm)   Pulse (!) 102   Temp 98.9 F (37.2 C) (Oral)   Resp 20   Ht 5' (1.524 m)   Wt 54.4 kg   SpO2 100%   BMI 23.44 kg/m   Physical Exam Vitals signs and nursing note reviewed.  Constitutional:      General: She is not in acute distress.    Appearance: She is well-developed.  HENT:     Head: Normocephalic and atraumatic.  Eyes:     Conjunctiva/sclera: Conjunctivae normal.  Neck:     Musculoskeletal:  Neck supple.  Cardiovascular:     Rate and Rhythm: Regular rhythm. Tachycardia present.     Pulses: Normal pulses.     Heart sounds: Normal heart sounds. No murmur.  Pulmonary:     Effort: Pulmonary effort is normal. No respiratory distress.     Breath sounds: Normal breath sounds. No wheezing, rhonchi or rales.     Comments: Cough on exam Abdominal:     General: Bowel sounds are normal. There is no distension.     Palpations: Abdomen is soft.     Tenderness: There is no abdominal tenderness. There is no guarding or rebound.  Skin:    General: Skin is warm and dry.  Neurological:     Mental Status: She is alert.      ED Treatments / Results  Labs (all labs ordered are listed, but only abnormal results are displayed) Labs Reviewed  NOVEL CORONAVIRUS, NAA (HOSP ORDER, SEND-OUT TO REF LAB; TAT 18-24 HRS)    EKG None  Radiology Dg Chest 2 View  Result Date: 11/17/2018 CLINICAL DATA:  Generalized body aches, productive cough and flu symptoms for 2 days. EXAM: CHEST - 2 VIEW COMPARISON:  PA and lateral chest 01/21/2017. FINDINGS: The lungs are clear. Heart size is normal. There is no pneumothorax or pleural fluid. No bony abnormality. IMPRESSION: Normal chest. Electronically Signed   By: Drusilla Kanner M.D.   On: 11/17/2018 08:12    Procedures Procedures (including critical care time)  Medications Ordered in ED Medications - No data to display   Initial Impression / Assessment and Plan / ED Course  I have reviewed the triage vital signs and the nursing notes.  Pertinent labs & imaging results that were available during my care of the patient were reviewed by me and considered in my medical decision making (see chart for details).    Final Clinical Impressions(s) / ED Diagnoses   Final diagnoses:  Upper respiratory tract infection, unspecified type  Suspected Covid-19 Virus Infection   Patient is a 33 year old female who presents the emergency department today for  evaluation of rhinorrhea, congestion, productive cough, generalized body aches for the last 3 days.  She has not taken her temperature at home and does not know if she has had a fever.  She is had some shortness of breath but denies any chest pain.  Denies any abdominal pain nausea vomiting or diarrhea.  Denies any urinary symptoms.  She denies any known coronavirus exposures.  CXR ordered in triage is negative for any obvious bacterial pneumonia, pneumothorax or other abnormality.   I recommended to the patient Doutova tested because I have is increased suspicion for this versus other viral viral process.  Patient is refusing Coban testing because she does not like things in her nose.  I recommended that she quarantine for 2 weeks because we are not  able to check her CODE STATUS.  Will give symptomatic medications. Advised on quarantine measures and pcp f/u in 1 week. Advised on return precautions. She voices understanding and is in agreement w/ plan. All questions answered, pt stable or d.c  Tajana L Justo was evaluated in Emergency Department on 11/17/2018 for the symptoms described in the history of present illness. She was evaluated in the context of the global COVID-19 pandemic, which necessitated consideration that the patient might be at risk for infection with the SARS-CoV-2 virus that causes COVID-19. Institutional protocols and algorithms that pertain to the evaluation of patients at risk for COVID-19 are in a state of rapid change based on information released by regulatory bodies including the CDC and federal and state organizations. These policies and algorithms were followed during the patient's care in the ED.   ED Discharge Orders         Ordered    albuterol (VENTOLIN HFA) 108 (90 Base) MCG/ACT inhaler  Every 6 hours PRN     11/17/18 0840    benzonatate (TESSALON) 100 MG capsule  Every 8 hours     11/17/18 0840           Lionel Woodberry, [employer], PA-C 11/17/18 0841     Rodney Booze, PA-C 11/17/18 5956    Rodney Booze, PA-C 11/17/18 0900    Isla Pence, MD 11/17/18 1443

## 2018-11-17 NOTE — ED Notes (Signed)
Discharge paperwork reviewed with pt, including prescriptions.  Pt educated about self quarantining until symptoms resolved x72 hours.  Pt verbalized understanding.

## 2018-11-17 NOTE — ED Notes (Signed)
Pt refused nasopharyngeal COVID swab.  PA Cortni made aware.

## 2018-12-06 ENCOUNTER — Other Ambulatory Visit: Payer: Self-pay

## 2018-12-06 ENCOUNTER — Emergency Department (HOSPITAL_COMMUNITY)
Admission: EM | Admit: 2018-12-06 | Discharge: 2018-12-06 | Disposition: A | Discharge status: Home / Self Care | Payer: Self-pay | Attending: Emergency Medicine | Admitting: Emergency Medicine

## 2018-12-06 ENCOUNTER — Encounter (HOSPITAL_COMMUNITY): Payer: Self-pay

## 2018-12-06 DIAGNOSIS — Z5321 Procedure and treatment not carried out due to patient leaving prior to being seen by health care provider: Secondary | ICD-10-CM | POA: Insufficient documentation

## 2018-12-06 DIAGNOSIS — R600 Localized edema: Secondary | ICD-10-CM | POA: Insufficient documentation

## 2018-12-06 NOTE — ED Triage Notes (Signed)
Pt arrived stating the ring on her left finger has been stuck for about a week, reports attempting to get it off with no success. Finger swollen in triage.

## 2019-10-14 ENCOUNTER — Emergency Department (HOSPITAL_COMMUNITY)
Admission: EM | Admit: 2019-10-14 | Discharge: 2019-10-15 | Disposition: A | Discharge status: Home / Self Care | Payer: HRSA Program | Attending: Emergency Medicine | Admitting: Emergency Medicine

## 2019-10-14 ENCOUNTER — Encounter (HOSPITAL_COMMUNITY): Payer: Self-pay | Admitting: Emergency Medicine

## 2019-10-14 ENCOUNTER — Emergency Department (HOSPITAL_COMMUNITY): Payer: HRSA Program

## 2019-10-14 DIAGNOSIS — J189 Pneumonia, unspecified organism: Secondary | ICD-10-CM

## 2019-10-14 DIAGNOSIS — U071 COVID-19: Secondary | ICD-10-CM

## 2019-10-14 DIAGNOSIS — F17210 Nicotine dependence, cigarettes, uncomplicated: Secondary | ICD-10-CM | POA: Diagnosis not present

## 2019-10-14 DIAGNOSIS — R0789 Other chest pain: Secondary | ICD-10-CM | POA: Diagnosis present

## 2019-10-14 DIAGNOSIS — J1282 Pneumonia due to coronavirus disease 2019: Secondary | ICD-10-CM | POA: Diagnosis not present

## 2019-10-14 LAB — BASIC METABOLIC PANEL
Anion gap: 10 (ref 5–15)
BUN: 6 mg/dL (ref 6–20)
CO2: 24 mmol/L (ref 22–32)
Calcium: 8.8 mg/dL — ABNORMAL LOW (ref 8.9–10.3)
Chloride: 103 mmol/L (ref 98–111)
Creatinine, Ser: 0.76 mg/dL (ref 0.44–1.00)
GFR calc Af Amer: >60 mL/min (ref >60)
GFR calc non Af Amer: >60 mL/min (ref >60)
Glucose, Bld: 90 mg/dL (ref 70–99)
Potassium: 3.9 mmol/L (ref 3.5–5.1)
Sodium: 137 mmol/L (ref 135–145)

## 2019-10-14 LAB — BASIC METABOLIC PANEL WITH GFR
Anion gap: 12 (ref 5–15)
BUN: 5 mg/dL — ABNORMAL LOW (ref 6–20)
CO2: 18 mmol/L — ABNORMAL LOW (ref 22–32)
Calcium: 7.7 mg/dL — ABNORMAL LOW (ref 8.9–10.3)
Chloride: 109 mmol/L (ref 98–111)
Creatinine, Ser: 0.65 mg/dL (ref 0.44–1.00)
GFR calc Af Amer: >60 mL/min
GFR calc non Af Amer: >60 mL/min
Glucose, Bld: 78 mg/dL (ref 70–99)
Potassium: 3.4 mmol/L — ABNORMAL LOW (ref 3.5–5.1)
Sodium: 139 mmol/L (ref 135–145)

## 2019-10-14 LAB — I-STAT BETA HCG BLOOD, ED (MC, WL, AP ONLY): I-stat hCG, quantitative: <5 m[IU]/mL

## 2019-10-14 LAB — CBC
HCT: 39.5 % (ref 36.0–46.0)
Hemoglobin: 13.1 g/dL (ref 12.0–15.0)
MCH: 31.4 pg (ref 26.0–34.0)
MCHC: 33.2 g/dL (ref 30.0–36.0)
MCV: 94.7 fL (ref 80.0–100.0)
Platelets: 343 10*3/uL (ref 150–400)
RBC: 4.17 MIL/uL (ref 3.87–5.11)
RDW: 13.5 % (ref 11.5–15.5)
WBC: 14.5 10*3/uL — ABNORMAL HIGH (ref 4.0–10.5)
nRBC: 0 % (ref 0.0–0.2)

## 2019-10-14 LAB — TROPONIN I (HIGH SENSITIVITY)
Troponin I (High Sensitivity): <2 ng/L
Troponin I (High Sensitivity): <2 ng/L (ref <18)

## 2019-10-14 MED ORDER — FENTANYL CITRATE (PF) 100 MCG/2ML IJ SOLN
100.0000 ug | Freq: Once | INTRAMUSCULAR | Status: AC
  Administered 2019-10-14: 100 ug via INTRAVENOUS
  Filled 2019-10-14: qty 2

## 2019-10-14 MED ORDER — IOHEXOL 350 MG/ML SOLN
100.0000 mL | Freq: Once | INTRAVENOUS | Status: AC | PRN
  Administered 2019-10-14: 100 mL via INTRAVENOUS

## 2019-10-14 MED ORDER — AMOXICILLIN 500 MG PO CAPS
1000.0000 mg | ORAL_CAPSULE | Freq: Three times a day (TID) | ORAL | Status: DC
  Filled 2019-10-14: qty 2

## 2019-10-14 MED ORDER — AZITHROMYCIN 250 MG PO TABS
500.0000 mg | ORAL_TABLET | Freq: Once | ORAL | Status: AC
  Administered 2019-10-14: 500 mg via ORAL
  Filled 2019-10-14: qty 2

## 2019-10-14 MED ORDER — SODIUM CHLORIDE 0.9 % IV SOLN
1.0000 g | Freq: Once | INTRAVENOUS | Status: AC
  Administered 2019-10-14: 1 g via INTRAVENOUS
  Filled 2019-10-14: qty 10

## 2019-10-14 MED ORDER — SODIUM CHLORIDE (PF) 0.9 % IJ SOLN
INTRAMUSCULAR | Status: AC
  Filled 2019-10-14: qty 50

## 2019-10-14 NOTE — ED Provider Notes (Signed)
[facility]-EMERGENCY DEPT Provider Note   CSN: [id number] Arrival date & time: 10/14/19  1429     History Chief Complaint  Patient presents with   Chest Pain    [name] is a 34 y.o. female.  Patient with history of tobacco dependence, pneumonia -- presents emergency department for severe right-sided chest pain, shortness of breath, and cough.  Patient has had a mild cough over the past 1 month.  It acutely worsened yesterday.  Cough is productive of sputum.  Patient states that it is hard to take a deep breath because of the pain when she breathes in and out.  She denies fevers, URI symptoms.  She has not taken the Covid vaccine.  She denies any sick contacts.  No nausea, vomiting, or diarrhea.  Patient was transported to the hospital by EMS.  On arrival blood pressure was 96/70.  She was given 500 cc normal saline bolus with improvement in blood pressure.  Patient denies risk factors for pulmonary embolism including: unilateral leg swelling, history of DVT/PE/other blood clots, use of exogenous hormones, recent immobilizations, recent surgery, recent travel (>4hr segment), malignancy, hemoptysis.          History reviewed. No pertinent past medical history.  Patient Active Problem List   Diagnosis Date Noted   Pelvic pain in female 01/18/2017   Trichomonas vaginitis 01/18/2017   PID (acute pelvic inflammatory disease) 01/18/2017   TOBACCO DEPENDENCE 04/24/2006    Past Surgical History:  Procedure Laterality Date   AMPUTATION Left 08/30/2016   Procedure: REVISION OF LEFT LONG FINGER AMPUTATION;  Surgeon: Mack Hook, MD;  Location: [facility] OR;  Service: Orthopedics;  Laterality: Left;   CESAREAN SECTION       OB History   No obstetric history on file.     No family history on file.  Social History   Tobacco Use   Smoking status: Current Every Day Smoker    Packs/day: 1.00    Types: Cigarettes   Smokeless tobacco: Never Used    Substance Use Topics   Alcohol use: Yes    Comment: daily-- close to a fifth and 7 beers (16 ounce)   Drug use: No    Home Medications Prior to Admission medications   Medication Sig Start Date End Date Taking? Authorizing Provider  albuterol (VENTOLIN HFA) 108 (90 Base) MCG/ACT inhaler Inhale 1-2 puffs into the lungs every 6 (six) hours as needed for wheezing or shortness of breath. 11/17/18   Couture, Cortni S, PA-C  doxycycline (VIBRA-TABS) 100 MG tablet Take 1 tablet (100 mg total) by mouth every 12 (twelve) hours. Patient not taking: Reported on 11/17/2018 01/22/17   Adam Phenix, MD  ibuprofen (ADVIL,MOTRIN) 600 MG tablet Take 1 tablet (600 mg total) by mouth every 6 (six) hours as needed for fever or headache. Patient not taking: Reported on 11/17/2018 01/22/17   Adam Phenix, MD  metroNIDAZOLE (FLAGYL) 500 MG tablet Take 1 tablet (500 mg total) by mouth every 12 (twelve) hours. Patient not taking: Reported on 11/17/2018 01/22/17   Adam Phenix, MD    Allergies    Patient has no known allergies.  Review of Systems   Review of Systems  Constitutional: Negative for chills and fever.  HENT: Negative for rhinorrhea and sore throat.   Eyes: Negative for redness.  Respiratory: Positive for cough and shortness of breath.   Cardiovascular: Positive for chest pain. Negative for leg swelling.  Gastrointestinal: Negative for abdominal pain, diarrhea, nausea  and vomiting.  Genitourinary: Negative for dysuria, frequency, hematuria and urgency.  Musculoskeletal: Negative for myalgias.  Skin: Negative for rash.  Neurological: Negative for headaches.    Physical Exam Updated Vital Signs BP 99/83 (BP Location: Right Arm)    Pulse 94    Temp 99.2 F (37.3 C) (Oral)    Resp (!) 22    LMP 10/07/2019 (Approximate)    SpO2 96%   Physical Exam Vitals and nursing note reviewed.  Constitutional:      General: She is in acute distress.     Appearance: She is well-developed.      Comments: Patient appears tearful and uncomfortable, in pain, holding right side of chest.  HENT:     Head: Normocephalic and atraumatic.     Right Ear: External ear normal.     Left Ear: External ear normal.     Nose: Nose normal.  Eyes:     Conjunctiva/sclera: Conjunctivae normal.  Cardiovascular:     Rate and Rhythm: Normal rate and regular rhythm.     Heart sounds: No murmur heard.   Pulmonary:     Effort: No accessory muscle usage or respiratory distress.     Breath sounds: No wheezing, rhonchi or rales.     Comments: No respiratory distress, but she is splinting taking shallow breaths due to pain. Abdominal:     Palpations: Abdomen is soft.     Tenderness: There is no abdominal tenderness. There is no guarding or rebound.  Musculoskeletal:     Cervical back: Normal range of motion and neck supple.     Right lower leg: No tenderness. No edema.     Left lower leg: No tenderness. No edema.     Comments: No clinical signs and symptoms of DVT.  Skin:    General: Skin is warm and dry.     Findings: No rash.  Neurological:     General: No focal deficit present.     Mental Status: She is alert. Mental status is at baseline.     Motor: No weakness.  Psychiatric:        Mood and Affect: Mood normal.     ED Results / Procedures / Treatments   Labs (all labs ordered are listed, but only abnormal results are displayed) Labs Reviewed  CBC - Abnormal; Notable for the following components:      Result Value   WBC 14.5 (*)    All other components within normal limits  BASIC METABOLIC PANEL - Abnormal; Notable for the following components:   Calcium 8.8 (*)    All other components within normal limits  BASIC METABOLIC PANEL - Abnormal; Notable for the following components:   Potassium 3.4 (*)    CO2 18 (*)    BUN 5 (*)    Calcium 7.7 (*)    All other components within normal limits  SARS CORONAVIRUS 2 BY RT PCR (HOSPITAL ORDER, PERFORMED IN [facility])  I-STAT  BETA HCG BLOOD, ED ([facility], [facility], [facility] ONLY)  TROPONIN I (HIGH SENSITIVITY)  TROPONIN I (HIGH SENSITIVITY)    EKG EKG Interpretation  Date/Time:  Thursday October 14 2019 14:40:35 EDT Ventricular Rate:  97 PR Interval:    QRS Duration: 77 QT Interval:  332 QTC Calculation: 422 R Axis:   80 Text Interpretation: Sinus rhythm Short PR interval Confirmed by Marianna Fuss ([id number]) on 10/14/2019 7:12:02 PM   Radiology DG Chest 2 View  Result Date: 10/14/2019 CLINICAL DATA:  Chest pain, cough  EXAM: CHEST - 2 VIEW COMPARISON:  11/17/2018 FINDINGS: Frontal and lateral views of the chest demonstrate an unremarkable cardiac silhouette. There is dense consolidation of the right middle lobe, compatible with pneumonia. No effusion or pneumothorax. No acute bony abnormalities. IMPRESSION: 1. Dense right middle lobe consolidation compatible with pneumonia. Electronically Signed   By: Sharlet Salina M.D.   On: 10/14/2019 16:02    Procedures Procedures (including critical care time)  Medications Ordered in ED Medications - No data to display  ED Course  I have reviewed the triage vital signs and the nursing notes.  Pertinent labs & imaging results that were available during my care of the patient were reviewed by me and considered in my medical decision making (see chart for details).  Patient seen and examined.  Patient with extended wait in waiting room prior to exam.  She had a chest x-ray showing a dense right middle lobe consolidation which is consistent with where her pain is.  However, patient's symptoms are not typical for community-acquired pneumonia.  She is in significant pain that is pleuritic in nature.  Vital signs are reassuring.  Given atypical symptoms, feel that she requires CT imaging of the chest to further evaluate possible pneumonia.  Given significant pain, feel will be reasonable to rule out pulmonary embolism as well.  Vital signs reviewed and are as follows: BP 99/83 (BP  Location: Right Arm)    Pulse 94    Temp 99.2 F (37.3 C) (Oral)    Resp (!) 22    LMP 10/07/2019 (Approximate)    SpO2 96%   11:43 PM patient updated on results.  I have ordered IV Rocephin, p.o. azithromycin, IV fentanyl for pain.  Discussed patient's current social situation.  She is homeless and sleeping outside.  I asked her directly if she would be able to obtain medications at discharge, and she states no.  Patient will require social work evaluation.  Unfortunately, this cannot be done until the morning.  Plan: Follow-up on Covid test, ambulate to ensure no hypoxia, social work consult for medication and housing recommendations.  Signout to [place] PA-C at shift change.      MDM Rules/Calculators/A&P                          Pending completion of eval -- need for social work.    Final Clinical Impression(s) / ED Diagnoses Final diagnoses:  Multifocal pneumonia    Rx / DC Orders ED Discharge Orders    None       Renne Crigler, PA-C 10/14/19 2346    Milagros Loll, MD 10/15/19 [phone]

## 2019-10-14 NOTE — ED Notes (Signed)
Pt ambulated gait steady while on pulse oximetry. Pt stats stayed at 94%! Pt did not show or complain of any dizziness or signs of distress while walking

## 2019-10-14 NOTE — ED Triage Notes (Signed)
Per EMS, patient from home, c/o chest pain worsening with cough x1 month.   18g L AC NS EMS 96/70 initial BP 124/87 after fluids

## 2019-10-14 NOTE — Discharge Instructions (Addendum)
Please read and follow all provided instructions.  Your diagnoses today include:  1. Multifocal pneumonia   2. COVID-19     Tests performed today include: Blood counts and electrolytes - shows high infection fighting cells Chest x-ray -- shows pneumonia CT chest -- shows pneumonia COVID test  Vital signs. See below for your results today.   Take any prescribed medications only as directed. Augmentin & Azithromycin: These are antibiotics that will need to be taken daily as directed to treat pneumonia. Motrin & Tylenol: to be used every 6 hours as needed for pain and fever Mucinex: as needed for cough  Home care instructions:  Follow any educational materials contained in this packet.  Take the complete course of antibiotics that you were prescribed.   BE VERY CAREFUL not to take multiple medicines containing Tylenol (also called acetaminophen). Doing so can lead to an overdose which can damage your liver and cause liver failure and possibly death.   Follow-up instructions: Please follow-up with your primary care provider in the next 3 days for further evaluation of your symptoms and to ensure resolution of your infection.   Return instructions:  Please return to the Emergency Department if you experience worsening symptoms.  Return immediately with worsening breathing, worsening shortness of breath, or if you feel it is taking you more effort to breathe.  Please return if you have any other emergent concerns.  Additional Information:  Your vital signs today were: BP 115/87 (BP Location: Right Arm)   Pulse 83   Temp 99.2 F (37.3 C) (Oral)   Resp 16   LMP 10/07/2019 (Approximate)   SpO2 100%  If your blood pressure (BP) was elevated above 135/85 this visit, please have this repeated by your doctor within one month. --------------

## 2019-10-14 NOTE — ED Provider Notes (Cosign Needed)
Care assumed from [facility].  Please see her full H&P.  In short,  [name] is a 34 y.o. female presents for cough and chest pain x 1 month.  Pt very uncomfortable on initial provider exam.  CXR with significant consolidation, but given severe chest pain CT PE obtained.  No evidence of PE, but multifocal pneumonia.  Pt is homeless and unable to afford her medications.  Plan: awaiting COVID test.  Will need to monitor SPo2 with ambulation. Pt will see SW in AM for medication assistance before discharge.    Physical Exam  BP 99/83 (BP Location: Right Arm)   Pulse 92   Temp 99.2 F (37.3 C) (Oral)   Resp (!) 22   LMP 10/07/2019 (Approximate)   SpO2 99%   Physical Exam Vitals and nursing note reviewed.  Constitutional:      General: She is not in acute distress.    Appearance: She is well-developed. She is ill-appearing.     Comments: Pt very uncomfortable  HENT:     Head: Normocephalic.  Eyes:     General: No scleral icterus.    Conjunctiva/sclera: Conjunctivae normal.  Cardiovascular:     Rate and Rhythm: Normal rate.  Pulmonary:     Effort: Tachypnea and accessory muscle usage present.     Comments: Mild tachypnea. Increased work of breathing with splinting. Musculoskeletal:        General: Normal range of motion.     Cervical back: Normal range of motion.  Skin:    General: Skin is warm and dry.  Neurological:     Mental Status: She is alert.     ED Course/Procedures   Clinical Course as of Oct 15 646  Thu Oct 14, 2019  2357 No hypoxia at rest  SpO2: 99 % [HM]  2358 Leukocytosis noted  WBC(!): 14.5 [HM]  2358 Multifocal pneumonia  CT Angio Chest PE W and/or Wo Contrast [HM]  Fri Oct 15, 2019  0222 Pt ambulates with SPO2 of 94%.  Some increased work of breathing, but no respiratory distress.   [HM]  0238 Positive - will admit for continued increased work of breathing.  SPO2 while sleeping at 90%.    SARS Coronavirus 2(!): POSITIVE [HM]     Clinical Course User Index [HM] Connie Lasater, Dahlia Client, PA-C    Procedures  MDM   Pt with multifocal pneumonia.  She has increased work of breathing and decreasing oxygen saturations here in the ED.  She is homeless and has no ability for monitoring or follow-up.  Given significant consolidation on CXR and CT scan pt given Rocephin and Azithro here in the ED.  She will need admission for COVID-19 and pneumonia.  [name] was evaluated in Emergency Department on 10/15/2019 for the symptoms described in the history of present illness. She was evaluated in the context of the global COVID-19 pandemic, which necessitated consideration that the patient might be at risk for infection with the SARS-CoV-2 virus that causes COVID-19. Institutional protocols and algorithms that pertain to the evaluation of patients at risk for COVID-19 are in a state of rapid change based on information released by regulatory bodies including the CDC and federal and state organizations. These policies and algorithms were followed during the patient's care in the ED.  3:42 AM Discussed patient's case with hospitalist, Dr. Julian Reil.  I have recommended admission and patient (and family if present) agree with this plan. Admitting physician has evaluated patient and does not feel she meets  inpatient criteria.  Pt will be given MAB here in the ED and she will be evaluated by SW in the AM.  Will continue to monitor oxygen saturations and work of breathing.     6:48 AM At shift change care was transferred to [facility] who will re-evaulate and determine disposition.      Multifocal pneumonia  COVID-19      Dierdre Forth, Cordelia Poche 10/15/19 5859

## 2019-10-15 DIAGNOSIS — J1282 Pneumonia due to coronavirus disease 2019: Secondary | ICD-10-CM

## 2019-10-15 DIAGNOSIS — J189 Pneumonia, unspecified organism: Secondary | ICD-10-CM

## 2019-10-15 DIAGNOSIS — U071 COVID-19: Secondary | ICD-10-CM | POA: Diagnosis present

## 2019-10-15 LAB — SARS CORONAVIRUS 2 BY RT PCR (HOSPITAL ORDER, PERFORMED IN ~~LOC~~ HOSPITAL LAB): SARS Coronavirus 2: POSITIVE — AB

## 2019-10-15 LAB — PROCALCITONIN: Procalcitonin: 2.49 ng/mL

## 2019-10-15 MED ORDER — EPINEPHRINE 0.3 MG/0.3ML IJ SOAJ: 0.3000 mg | Freq: Once | INTRAMUSCULAR | Status: DC | PRN

## 2019-10-15 MED ORDER — IBUPROFEN 600 MG PO TABS: 600.0000 mg | ORAL_TABLET | Freq: Four times a day (QID) | ORAL | 0 refills | Status: DC | PRN

## 2019-10-15 MED ORDER — FAMOTIDINE IN NACL 20-0.9 MG/50ML-% IV SOLN: 20.0000 mg | Freq: Once | INTRAVENOUS | Status: DC | PRN

## 2019-10-15 MED ORDER — AMOXICILLIN-POT CLAVULANATE 875-125 MG PO TABS: 1.0000 | ORAL_TABLET | Freq: Two times a day (BID) | ORAL | 0 refills | Status: DC

## 2019-10-15 MED ORDER — DIPHENHYDRAMINE HCL 50 MG/ML IJ SOLN: 50.0000 mg | Freq: Once | INTRAMUSCULAR | Status: DC | PRN

## 2019-10-15 MED ORDER — PREDNISONE 50 MG PO TABS: 50.0000 mg | ORAL_TABLET | Freq: Every day | ORAL | 0 refills | Status: AC

## 2019-10-15 MED ORDER — DM-GUAIFENESIN ER 30-600 MG PO TB12: 1.0000 | ORAL_TABLET | Freq: Two times a day (BID) | ORAL | 0 refills | Status: DC

## 2019-10-15 MED ORDER — SODIUM CHLORIDE 0.9 % IV BOLUS
500.0000 mL | Freq: Once | INTRAVENOUS | Status: AC
  Administered 2019-10-15: 500 mL via INTRAVENOUS

## 2019-10-15 MED ORDER — ACETAMINOPHEN 500 MG PO TABS: 1000.0000 mg | ORAL_TABLET | Freq: Four times a day (QID) | ORAL | 0 refills | Status: DC | PRN

## 2019-10-15 MED ORDER — ACETAMINOPHEN 500 MG PO TABS
1000.0000 mg | ORAL_TABLET | Freq: Once | ORAL | Status: AC
  Administered 2019-10-15: 1000 mg via ORAL
  Filled 2019-10-15: qty 2

## 2019-10-15 MED ORDER — DEXAMETHASONE SODIUM PHOSPHATE 10 MG/ML IJ SOLN
10.0000 mg | Freq: Once | INTRAMUSCULAR | Status: AC
  Administered 2019-10-15: 10 mg via INTRAVENOUS
  Filled 2019-10-15: qty 1

## 2019-10-15 MED ORDER — METHYLPREDNISOLONE SODIUM SUCC 125 MG IJ SOLR: 125.0000 mg | Freq: Once | INTRAMUSCULAR | Status: DC | PRN

## 2019-10-15 MED ORDER — SODIUM CHLORIDE 0.9 % IV SOLN
1200.0000 mg | Freq: Once | INTRAVENOUS | Status: AC
  Administered 2019-10-15: 1200 mg via INTRAVENOUS
  Filled 2019-10-15: qty 1200

## 2019-10-15 MED ORDER — AZITHROMYCIN 250 MG PO TABS: 250.0000 mg | ORAL_TABLET | Freq: Every day | ORAL | 0 refills | Status: DC

## 2019-10-15 MED ORDER — SODIUM CHLORIDE 0.9 % IV SOLN: INTRAVENOUS | Status: DC | PRN

## 2019-10-15 MED ORDER — ALBUTEROL SULFATE HFA 108 (90 BASE) MCG/ACT IN AERS: 2.0000 | INHALATION_SPRAY | Freq: Once | RESPIRATORY_TRACT | Status: DC | PRN

## 2019-10-15 MED FILL — IBUPROFEN 600 MG TABLET: 600 | 7 days supply | Qty: 30 | Fill #0

## 2019-10-15 MED FILL — AZITHROMYCIN 250 MG TABS: 250 | 4 days supply | Qty: 4 | Fill #0

## 2019-10-15 MED FILL — predniSONE 50 MG TABS: 50 | 5 days supply | Qty: 5 | Fill #0

## 2019-10-15 MED FILL — AMOX-CLAV 875-125 MG TABLET: 875-125 | 7 days supply | Qty: 14 | Fill #0

## 2019-10-15 NOTE — Progress Notes (Signed)
TOC CM picked up pt's medications from [facility] to take with her to [facility]. Utilized MATCH. Pt can only use once per year. Isidoro Donning RN CCM, [facility] ED TOC CM [phone]

## 2019-10-15 NOTE — ED Provider Notes (Signed)
Care assumed from PA [facility] and PA Rhea Bleacher, please see their notes for full details, but in brief [name] is a 34 y.o. female who presents with shortness of breath and chest pain.  Patient found to be Covid positive with multifocal pneumonia.  When sleeping patient with O2 sat of 90%, maintained 94% while ambulatory but with increased respiratory effort.  Hospitalist consulted for admission, but they did not feel admission was indicated, recommended Mab infusion, and social work consult.  Patient is homeless, does not have anywhere to stay or money to afford medications.  Patient was given IV Rocephin and azithromycin for potential overlying community-acquired pneumonia, hospitalist also recommended checking procalcitonin to determine if antibiotic should be continued.  Plan: Patient pending social work consult, she received Mab infusion, will repeat ambulatory pulse ox and reevaluate patient.  Physical Exam  BP 99/63   Pulse 75   Temp 99.2 F (37.3 C) (Oral)   Resp (!) 22   LMP 10/07/2019 (Approximate)   SpO2 100%   Physical Exam Vitals and nursing note reviewed.  Constitutional:      General: She is not in acute distress.    Appearance: She is well-developed. She is not ill-appearing or diaphoretic.  HENT:     Head: Normocephalic and atraumatic.  Eyes:     General:        Right eye: No discharge.        Left eye: No discharge.  Cardiovascular:     Rate and Rhythm: Normal rate and regular rhythm.     Heart sounds: Normal heart sounds.  Pulmonary:     Effort: Pulmonary effort is normal. No respiratory distress.     Comments: Respirations equal and unlabored, satting well on room air, intermittent cough during exam Neurological:     Mental Status: She is alert.     Coordination: Coordination normal.  Psychiatric:        Behavior: Behavior normal.     ED Course/Procedures   Clinical Course as of Oct 14 920  Thu Oct 14, 2019  2357 No hypoxia at  rest  SpO2: 99 % [HM]  2358 Leukocytosis noted  WBC(!): 14.5 [HM]  2358 Multifocal pneumonia  CT Angio Chest PE W and/or Wo Contrast [HM]  Fri Oct 15, 2019  0222 Pt ambulates with SPO2 of 94%.  Some increased work of breathing, but no respiratory distress.   [HM]  0238 Positive - will admit for continued increased work of breathing.  SPO2 while sleeping at 90%.    SARS Coronavirus 2(!): POSITIVE [HM]  0630 Care assumed from PA Muthersbaugh pending social work consult. Pt currently resting and in no acute distress   [KF]  0821 Pro-Cal level is elevated. Discussed with hospitalist, this favors continuing antibiotics for superimposed bacterial pneumonia, recommends giving steroids as well  Procalcitonin - Baseline [KF]  0921 Repeated ambulatory pulse ox myself, patient maintained O2 sats of 95% or greater, was coughing some during ambulation but did not have significantly increased work of breathing   [KF]  [phone] Consult to social work and case Insurance account manager, they are working on Landscape architect for [employer], and will be able to get patient's medications before she goes to the hotel through the outpatient pharmacy I have prescribed antibiotics, steroids, as well as Motrin, Tylenol and Mucinex for symptom management.   [KF]    Clinical Course User Index [HM] Muthersbaugh, Dahlia Client, PA-C [KF] Dartha Lodge, PA-C    Procedures  MDM  BP 115/87 (BP Location: Right Arm)   Pulse 83   Temp 99.2 F (37.3 C) (Oral)   Resp 16   LMP 10/07/2019 (Approximate)   SpO2 100%   Patient Covid positive with likely superimposed bacterial pneumonia given elevated pro-Cal and leukocytosis.  O2 stats remaining stable here in the emergency department improved respiratory effort after receiving Mab infusion, steroids and antibiotics.  I reambulated the patient myself and she did not have any desaturations or significantly increased work of breathing.  Social work and case management are working on getting  patient into [employer] where she can have close monitoring, and all medications have been prescribed so that these can be picked up for the patient from outpatient pharmacy prior to transport hotel.  Awaiting approval for Covid hotel program.  Social work able to pick up all of patient's prescribed medications.  Awaiting final approval for Covid hotel.  Discharge pending transport to [facility].         Dartha Lodge, [place] 10/15/19 1417    Virgina Norfolk, DO 10/15/19 1451

## 2019-10-15 NOTE — Social Work (Signed)
TOC CSW received verbal consent from pt to sign Therapist, nutritional for transportation to [employer], room 210. CSW has contacted [employer]. Patient has been accepted in to [employer] by [employer] [facility].   CSW will continue to follow for dc needs.  Lynx Goodrich Tarpley-Carter, MSW, LCSW-A                  [facility] ED Transitions of CareClinical Social Worker [email or web address] [phone]

## 2019-10-15 NOTE — Social Work (Signed)
TOC CSW consult request has been received. CSW attempting to follow up at present time.   CSW will continue to follow for dc needs.  Kyree Fedorko Tarpley-Carter, MSW, LCSW-A                  [facility] ED Transitions of CareClinical Social Worker [email or web address] [phone] 

## 2019-10-15 NOTE — Consult Note (Signed)
Medical Consultation   [name]  IOE:[id number]  DOB: [birth year]  DOA: 10/14/2019  PCP: [facility], User, MD (Inactive)     Requesting physician: Muthersbaugh PA-C  Reason for consultation: COVID-19 pneumonia   History of Present Illness: [name] is an 34 y.o. female with h/o smoking, homeless.  Pt presents to ED for cough and CP.  Cough has been ongoing "for a while" the CP however just started yesterday.  Symptoms are constant and persistent.  Pt has not had COVID vaccine.  Work up in ED demonstrates RUL and RML PNA, WBC 14k, pt is COVID+.  Pt with ambulatory pulse ox of 94% on room air at this time.  Pt given rocephin + azithromycin for concern for CAP.    Review of Systems:  ROS As per HPI otherwise 10 point review of systems negative.     Past Medical History: History reviewed. No pertinent past medical history.  Past Surgical History: Past Surgical History:  Procedure Laterality Date  . AMPUTATION Left 08/30/2016   Procedure: REVISION OF LEFT LONG FINGER AMPUTATION;  Surgeon: Mack Hook, MD;  Location: [facility];  Service: Orthopedics;  Laterality: Left;  . CESAREAN SECTION       Allergies:  No Known Allergies   Social History:  reports that she has been smoking cigarettes. She has been smoking about 1.00 pack per day. She has never used smokeless tobacco. She reports current alcohol use. She reports that she does not use drugs.   Family History: No family history on file.   Physical Exam: Vitals:   10/14/19 1442 10/14/19 2215 10/14/19 2300  BP: 111/88 99/83   Pulse: (!) 102 94 92  Resp: (!) 22 (!) 22   Temp: 99.2 F (37.3 C)    TempSrc: Oral    SpO2: 95% 96% 99%    Constitutional: Alert and awake, oriented x3, not in any acute distress. Eyes: PERLA, EOMI, irises appear normal, anicteric sclera,  ENMT: external ears and nose appear normal            Lips appears normal, oropharynx mucosa, tongue,  posterior pharynx appear normal  Neck: neck appears normal, no masses, normal ROM, no thyromegaly, no JVD  CVS: S1-S2 clear, no murmur rubs or gallops, no LE edema, normal pedal pulses  Respiratory:  clear to auscultation bilaterally, no wheezing, rales or rhonchi. Respiratory effort normal. No accessory muscle use.  Abdomen: soft nontender, nondistended, normal bowel sounds, no hepatosplenomegaly, no hernias  Musculoskeletal: : no cyanosis, clubbing or edema noted bilaterally  Neuro: Cranial nerves II-XII intact, strength, sensation, reflexes Psych: judgement and insight appear normal, stable mood and affect, mental status Skin: no rashes or lesions or ulcers, no induration or nodules   Data reviewed:  I have personally reviewed following labs and imaging studies Labs:  CBC: Recent Labs  Lab 10/14/19 1447  WBC 14.5*  HGB 13.1  HCT 39.5  MCV 94.7  PLT 343    Basic Metabolic Panel: Recent Labs  Lab 10/14/19 1545 10/14/19 2230  NA 137 139  K 3.9 3.4*  CL 103 109  CO2 24 18*  GLUCOSE 90 78  BUN 6 5*  CREATININE 0.76 0.65  CALCIUM 8.8* 7.7*   GFR CrCl cannot be calculated (Unknown ideal weight.). Liver Function Tests: No results for input(s): AST, ALT, ALKPHOS, BILITOT, PROT, ALBUMIN in the last 168 hours. No results for input(s): LIPASE, AMYLASE in the  last 168 hours. No results for input(s): AMMONIA in the last 168 hours. Coagulation profile No results for input(s): INR, PROTIME in the last 168 hours.  Cardiac Enzymes: No results for input(s): CKTOTAL, CKMB, CKMBINDEX, TROPONINI in the last 168 hours. BNP: Invalid input(s): POCBNP CBG: No results for input(s): GLUCAP in the last 168 hours. D-Dimer No results for input(s): DDIMER in the last 72 hours. Hgb A1c No results for input(s): HGBA1C in the last 72 hours. Lipid Profile No results for input(s): CHOL, HDL, LDLCALC, TRIG, CHOLHDL, LDLDIRECT in the last 72 hours. Thyroid function studies No results for  input(s): TSH, T4TOTAL, T3FREE, THYROIDAB in the last 72 hours.  Invalid input(s): FREET3 Anemia work up No results for input(s): VITAMINB12, FOLATE, FERRITIN, TIBC, IRON, RETICCTPCT in the last 72 hours. Urinalysis    Component Value Date/Time   COLORURINE YELLOW 01/18/2017 1203   APPEARANCEUR HAZY (A) 01/18/2017 1203   LABSPEC 1.028 01/18/2017 1203   PHURINE 5.0 01/18/2017 1203   GLUCOSEU NEGATIVE 01/18/2017 1203   HGBUR SMALL (A) 01/18/2017 1203   BILIRUBINUR NEGATIVE 01/18/2017 1203   KETONESUR 5 (A) 01/18/2017 1203   PROTEINUR 100 (A) 01/18/2017 1203   UROBILINOGEN 0.2 10/26/2008 1351   NITRITE NEGATIVE 01/18/2017 1203   LEUKOCYTESUR SMALL (A) 01/18/2017 1203     Microbiology Recent Results (from the past 240 hour(s))  SARS Coronavirus 2 by RT PCR (hospital order, performed in [facility] hospital lab) Nasopharyngeal Nasopharyngeal Swab     Status: Abnormal   Collection Time: 10/15/19 12:00 AM   Specimen: Nasopharyngeal Swab  Result Value Ref Range Status   SARS Coronavirus 2 POSITIVE (A) NEGATIVE Final    Comment: RESULT CALLED TO, READ BACK BY AND VERIFIED WITH: RN J SMITH AT 0225 10/15/19 CRUICKSHANK A (NOTE) SARS-CoV-2 target nucleic acids are DETECTED  SARS-CoV-2 RNA is generally detectable in upper respiratory specimens  during the acute phase of infection.  Positive results are indicative  of the presence of the identified virus, but do not rule out bacterial infection or co-infection with other pathogens not detected by the test.  Clinical correlation with patient history and  other diagnostic information is necessary to determine patient infection status.  The expected result is negative.  Fact Sheet for Patients:   BoilerBrush.com.cy   Fact Sheet for Healthcare Providers:   https://pope.com/    This test is not yet approved or cleared by the [place] FDA and  has been authorized for detection and/or  diagnosis of SARS-CoV-2 by FDA under an Emergency Use Authorization (EUA).  This EUA will remain in effect (meaning t his test can be used) for the duration of  the COVID-19 declaration under Section 564(b)(1) of the Act, 21 U.S.C. section 360-bbb-3(b)(1), unless the authorization is terminated or revoked sooner.  Performed at [facility], [address] [place], [place] [address]        Inpatient Medications:   Scheduled Meds: . sodium chloride (PF)       Continuous Infusions: . sodium chloride    . casirivimab-imdevimab (REGEN-COV) IVPB    . famotidine (PEPCID) IV       Radiological Exams on Admission: DG Chest 2 View  Result Date: 10/14/2019 CLINICAL DATA:  Chest pain, cough EXAM: CHEST - 2 VIEW COMPARISON:  11/17/2018 FINDINGS: Frontal and lateral views of the chest demonstrate an unremarkable cardiac silhouette. There is dense consolidation of the right middle lobe, compatible with pneumonia. No effusion or pneumothorax. No acute bony abnormalities. IMPRESSION: 1. Dense  right middle lobe consolidation compatible with pneumonia. Electronically Signed   By: Sharlet Salina M.D.   On: 10/14/2019 16:02   CT Angio Chest PE W and/or Wo Contrast  Result Date: 10/14/2019 CLINICAL DATA:  Right-sided chest pain. Cough. Shortness of breath. EXAM: CT ANGIOGRAPHY CHEST WITH CONTRAST TECHNIQUE: Multidetector CT imaging of the chest was performed using the standard protocol during bolus administration of intravenous contrast. Multiplanar CT image reconstructions and MIPs were obtained to evaluate the vascular anatomy. CONTRAST:  OMNIPAQUE IOHEXOL 350 MG/ML SOLN COMPARISON:  Radiograph earlier today. FINDINGS: Cardiovascular: There are no filling defects within the pulmonary arteries to suggest pulmonary embolus. Mild cardiac and breathing motion partially obscures subsegmental left lower lobe evaluation thoracic aorta is normal in caliber. No aortic dissection. Heart  is normal in size. No pericardial effusion. Mediastinum/Nodes: Prominent right hilar and low subcarinal nodes measuring 10-11 mm likely reactive. No visualized thyroid nodule. Esophagus is unremarkable. Lungs/Pleura: Confluent consolidation involving the anterior right middle lobe. Confluent and patchy consolidation involving the posterior basal right lower lobe. No evidence of central obstructing lesion. Trachea and central bronchi are patent. The left lung is clear. No pleural fluid. Upper Abdomen: No acute abnormality or unexpected findings. Musculoskeletal: There are no acute or suspicious osseous abnormalities. Review of the MIP images confirms the above findings. IMPRESSION: 1. No pulmonary embolus. 2. Right middle and lower lobe pneumonia. Prominent right hilar and low subcarinal nodes are likely reactive. Electronically Signed   By: Narda Rutherford M.D.   On: 10/14/2019 23:06    Impression/Recommendations Principal Problem:   Pneumonia due to COVID-19 virus   1) Pneumonia -  Pt COVID-19 positive, PNA most likely due to COVID virus therefore.  Got rocephin + azithromycin in ED for ? Of CAP  Patient without oxygen requirement, ambulatory pulse ox on RA at 94% as documented in ED note.  Inpatient admission for COVID not indicated at this time.  PSI score = 23.  Outpt treatment reasonable if this is CAP.  Check procalcitonin, would use this to guide if continuing ABx indicated or not.  Will put in orders for MAB treatment for COVID-19 in this patient with h/o smoking as her risk factor.  2) Homelessness -  Rec SW consult      Zayyan Mullen M. D.O. [facility] 10/15/2019, 3:00 AM

## 2019-10-15 NOTE — Social Work (Signed)
TOC CSW reached out to [employer] [facility] [phone] for application to [employer].  Application was filled out by CSW and Kennyth Arnold, Triage RN assisted pt with signature.  Application was faxed to [employer] [facility] at [phone].   CSW will be retrieving medication from pharmacy, so pt can take them with her to hotel.  Pt will be dc'd today.   CSW will continue to follow for dc needs.  Johnella Crumm Tarpley-Carter, MSW, LCSW-A                  [facility] ED Transitions of CareClinical Social Worker [email or web address] [phone]

## 2019-10-15 NOTE — ED Notes (Signed)
I attempted to walk patient unable to patient started yelling her chest was hurting when I tried to get her to stand up.  I made PA aware

## 2019-10-15 NOTE — Discharge Planning (Signed)
°  MATCH Medication Assistance Card Name: [name] ID (MRN): [id number] Bin: [id number] RX Group: [id number] Discharge Date: 10/15/19 Expiration Date:10/23/2019                                           (must be filled within 7 days of discharge)     You have been approved to have the prescriptions written by your discharging physician filled through our [facility] (Medication Assistance Through [facility]) program. This program allows for a one-time (no refills) 34-day supply of selected medications for a low copay amount.  The copay is $0.00 per prescription.  Only certain pharmacies are participating in this program with [facility]. You will need to select one of the pharmacies from the attached list and take your prescriptions, this letter, and your photo ID to one of the participating pharmacies.   We are excited that you are able to use the [facility] program to get your medications. These prescriptions must be filled within 7 days of hospital discharge or they will no longer be valid for the [facility] program. Should you have any problems with your prescriptions please contact your case management team member at [phone] for [facility] [place]/ [facility].  Thank you, [facility] Care Management

## 2020-09-05 ENCOUNTER — Emergency Department
Admission: EM | Admit: 2020-09-05 | Discharge: 2020-09-05 | Disposition: A | Discharge status: Home / Self Care | Payer: Self-pay | Attending: Student in an Organized Health Care Education/Training Program | Admitting: Student in an Organized Health Care Education/Training Program

## 2020-09-05 ENCOUNTER — Encounter: Payer: Self-pay | Admitting: Emergency Medicine

## 2020-09-05 DIAGNOSIS — R110 Nausea: Secondary | ICD-10-CM | POA: Insufficient documentation

## 2020-09-05 DIAGNOSIS — R1030 Lower abdominal pain, unspecified: Secondary | ICD-10-CM | POA: Insufficient documentation

## 2020-09-05 DIAGNOSIS — Z5321 Procedure and treatment not carried out due to patient leaving prior to being seen by health care provider: Secondary | ICD-10-CM | POA: Insufficient documentation

## 2020-09-05 LAB — CBC
HCT: 38.3 % (ref 36.0–46.0)
Hemoglobin: 12.5 g/dL (ref 12.0–15.0)
MCH: 31.1 pg (ref 26.0–34.0)
MCHC: 32.6 g/dL (ref 30.0–36.0)
MCV: 95.3 fL (ref 80.0–100.0)
Platelets: 435 10*3/uL — ABNORMAL HIGH (ref 150–400)
RBC: 4.02 MIL/uL (ref 3.87–5.11)
RDW: 13.8 % (ref 11.5–15.5)
WBC: 16.5 10*3/uL — ABNORMAL HIGH (ref 4.0–10.5)
nRBC: 0 % (ref 0.0–0.2)

## 2020-09-05 LAB — COMPREHENSIVE METABOLIC PANEL
ALT: 20 U/L (ref 0–44)
AST: 23 U/L (ref 15–41)
Albumin: 3.3 g/dL — ABNORMAL LOW (ref 3.5–5.0)
Alkaline Phosphatase: 46 U/L (ref 38–126)
Anion gap: 6 (ref 5–15)
BUN: 12 mg/dL (ref 6–20)
CO2: 28 mmol/L (ref 22–32)
Calcium: 9 mg/dL (ref 8.9–10.3)
Chloride: 103 mmol/L (ref 98–111)
Creatinine, Ser: 0.87 mg/dL (ref 0.44–1.00)
GFR, Estimated: >60 mL/min (ref >60)
Glucose, Bld: 106 mg/dL — ABNORMAL HIGH (ref 70–99)
Potassium: 3.5 mmol/L (ref 3.5–5.1)
Sodium: 137 mmol/L (ref 135–145)
Total Bilirubin: 0.4 mg/dL (ref 0.3–1.2)
Total Protein: 7.1 g/dL (ref 6.5–8.1)

## 2020-09-05 LAB — URINALYSIS, COMPLETE (UACMP) WITH MICROSCOPIC
Bacteria, UA: NONE SEEN
Bilirubin Urine: NEGATIVE
Glucose, UA: NEGATIVE mg/dL
Hgb urine dipstick: NEGATIVE
Ketones, ur: 5 mg/dL — AB
Leukocytes,Ua: NEGATIVE
Nitrite: NEGATIVE
Protein, ur: NEGATIVE mg/dL
Specific Gravity, Urine: 1.028 (ref 1.005–1.030)
pH: 7 (ref 5.0–8.0)

## 2020-09-05 LAB — LIPASE, BLOOD: Lipase: 65 U/L — ABNORMAL HIGH (ref 11–51)

## 2020-09-05 NOTE — ED Notes (Signed)
SO came into lobby, picked pt up and carried outside

## 2020-09-05 NOTE — ED Triage Notes (Signed)
Pt c/o lower abdominal cramping x3 days with nausea. Pt denies emesis and diarrhea. Last BM was hard and hurt with defecation. Last normal BM x2 days.

## 2020-09-05 NOTE — ED Notes (Signed)
Pt has not returned

## 2021-04-29 ENCOUNTER — Other Ambulatory Visit: Payer: Self-pay

## 2021-04-29 ENCOUNTER — Emergency Department (HOSPITAL_COMMUNITY): Payer: Self-pay

## 2021-04-29 ENCOUNTER — Emergency Department (HOSPITAL_COMMUNITY)
Admission: EM | Admit: 2021-04-29 | Discharge: 2021-05-01 | Disposition: A | Discharge status: Home / Self Care | Payer: Self-pay | Attending: Emergency Medicine | Admitting: Emergency Medicine

## 2021-04-29 ENCOUNTER — Encounter (HOSPITAL_COMMUNITY): Payer: Self-pay | Admitting: *Deleted

## 2021-04-29 DIAGNOSIS — F1910 Other psychoactive substance abuse, uncomplicated: Secondary | ICD-10-CM

## 2021-04-29 DIAGNOSIS — F22 Delusional disorders: Secondary | ICD-10-CM | POA: Insufficient documentation

## 2021-04-29 DIAGNOSIS — F1010 Alcohol abuse, uncomplicated: Secondary | ICD-10-CM | POA: Insufficient documentation

## 2021-04-29 DIAGNOSIS — Z20822 Contact with and (suspected) exposure to covid-19: Secondary | ICD-10-CM | POA: Insufficient documentation

## 2021-04-29 DIAGNOSIS — R4182 Altered mental status, unspecified: Secondary | ICD-10-CM | POA: Insufficient documentation

## 2021-04-29 DIAGNOSIS — F1994 Other psychoactive substance use, unspecified with psychoactive substance-induced mood disorder: Secondary | ICD-10-CM

## 2021-04-29 DIAGNOSIS — F1410 Cocaine abuse, uncomplicated: Secondary | ICD-10-CM | POA: Insufficient documentation

## 2021-04-29 DIAGNOSIS — D72829 Elevated white blood cell count, unspecified: Secondary | ICD-10-CM | POA: Insufficient documentation

## 2021-04-29 LAB — CBC WITH DIFFERENTIAL/PLATELET
Abs Immature Granulocytes: 0.08 10*3/uL — ABNORMAL HIGH (ref 0.00–0.07)
Basophils Absolute: 0.1 10*3/uL (ref 0.0–0.1)
Basophils Relative: 1 %
Eosinophils Absolute: 0 10*3/uL (ref 0.0–0.5)
Eosinophils Relative: 0 %
HCT: 37.3 % (ref 36.0–46.0)
Hemoglobin: 12.1 g/dL (ref 12.0–15.0)
Immature Granulocytes: 1 %
Lymphocytes Relative: 11 %
Lymphs Abs: 1.9 10*3/uL (ref 0.7–4.0)
MCH: 29.9 pg (ref 26.0–34.0)
MCHC: 32.4 g/dL (ref 30.0–36.0)
MCV: 92.1 fL (ref 80.0–100.0)
Monocytes Absolute: 1.7 10*3/uL — ABNORMAL HIGH (ref 0.1–1.0)
Monocytes Relative: 10 %
Neutro Abs: 13.3 10*3/uL — ABNORMAL HIGH (ref 1.7–7.7)
Neutrophils Relative %: 77 %
Platelets: 392 10*3/uL (ref 150–400)
RBC: 4.05 MIL/uL (ref 3.87–5.11)
RDW: 13.5 % (ref 11.5–15.5)
WBC: 17 10*3/uL — ABNORMAL HIGH (ref 4.0–10.5)
nRBC: 0 % (ref 0.0–0.2)

## 2021-04-29 LAB — CBG MONITORING, ED
Glucose-Capillary: 139 mg/dL — ABNORMAL HIGH (ref 70–99)
Glucose-Capillary: 64 mg/dL — ABNORMAL LOW (ref 70–99)

## 2021-04-29 LAB — I-STAT BETA HCG BLOOD, ED (MC, WL, AP ONLY): I-stat hCG, quantitative: <5 m[IU]/mL (ref <5)

## 2021-04-29 LAB — COMPREHENSIVE METABOLIC PANEL
ALT: 22 U/L (ref 0–44)
AST: 30 U/L (ref 15–41)
Albumin: 4.2 g/dL (ref 3.5–5.0)
Alkaline Phosphatase: 52 U/L (ref 38–126)
Anion gap: 18 — ABNORMAL HIGH (ref 5–15)
BUN: 8 mg/dL (ref 6–20)
CO2: 19 mmol/L — ABNORMAL LOW (ref 22–32)
Calcium: 8.6 mg/dL — ABNORMAL LOW (ref 8.9–10.3)
Chloride: 101 mmol/L (ref 98–111)
Creatinine, Ser: 0.89 mg/dL (ref 0.44–1.00)
GFR, Estimated: >60 mL/min (ref >60)
Glucose, Bld: 61 mg/dL — ABNORMAL LOW (ref 70–99)
Potassium: 3.4 mmol/L — ABNORMAL LOW (ref 3.5–5.1)
Sodium: 138 mmol/L (ref 135–145)
Total Bilirubin: 0.5 mg/dL (ref 0.3–1.2)
Total Protein: 7.1 g/dL (ref 6.5–8.1)

## 2021-04-29 LAB — RESP PANEL BY RT-PCR (FLU A&B, COVID) ARPGX2
Influenza A by PCR: NEGATIVE
Influenza B by PCR: NEGATIVE
SARS Coronavirus 2 by RT PCR: NEGATIVE

## 2021-04-29 LAB — SALICYLATE LEVEL: Salicylate Lvl: <7 mg/dL — ABNORMAL LOW (ref 7.0–30.0)

## 2021-04-29 LAB — ACETAMINOPHEN LEVEL: Acetaminophen (Tylenol), Serum: <10 ug/mL — ABNORMAL LOW (ref 10–30)

## 2021-04-29 LAB — ETHANOL: Alcohol, Ethyl (B): 42 mg/dL — ABNORMAL HIGH (ref <10)

## 2021-04-29 MED ORDER — THIAMINE HCL 100 MG/ML IJ SOLN: 100.0000 mg | Freq: Every day | INTRAMUSCULAR | Status: DC

## 2021-04-29 MED ORDER — LORAZEPAM 1 MG PO TABS: 0.0000 mg | ORAL_TABLET | Freq: Two times a day (BID) | ORAL | Status: DC

## 2021-04-29 MED ORDER — THIAMINE HCL 100 MG PO TABS
100.0000 mg | ORAL_TABLET | Freq: Every day | ORAL | Status: DC
  Administered 2021-04-30 – 2021-05-01 (×2): 100 mg via ORAL
  Filled 2021-04-29 (×3): qty 1

## 2021-04-29 MED ORDER — LORAZEPAM 2 MG/ML IJ SOLN
0.0000 mg | Freq: Four times a day (QID) | INTRAMUSCULAR | Status: DC
  Administered 2021-04-29: 2 mg via INTRAVENOUS
  Filled 2021-04-29: qty 1

## 2021-04-29 MED ORDER — LORAZEPAM 2 MG/ML IJ SOLN: 0.0000 mg | Freq: Two times a day (BID) | INTRAMUSCULAR | Status: DC

## 2021-04-29 MED ORDER — LORAZEPAM 1 MG PO TABS
0.0000 mg | ORAL_TABLET | Freq: Four times a day (QID) | ORAL | Status: DC
  Administered 2021-04-30 – 2021-05-01 (×2): 1 mg via ORAL
  Filled 2021-04-29 (×2): qty 1

## 2021-04-29 NOTE — ED Notes (Signed)
To ct

## 2021-04-29 NOTE — ED Notes (Signed)
Responsive to light tactile stim wakes up thrashing out about then goes back to sleep ?

## 2021-04-29 NOTE — ED Notes (Signed)
The [pt has pulled her iv out pulled off her wires and is jerking all over less ?

## 2021-04-29 NOTE — ED Notes (Signed)
The pt is alert and oriented x 4 °

## 2021-04-29 NOTE — ED Provider Notes (Signed)
23:30: Assumed care of patient @ shift change pending continued CIWA and TTS in AM.  ? ?Please see provider note for full H&P.  Briefly patient is a 36 year old female who presented to the emergency department due to drug intoxication with paranoia and hallucinations. ? ?Patient noted to have alcohol withdrawal.  Currently doing well on CIWA protocol, however will need to have continued monitoring.  TTS in the morning as currently the machine is not working for this. ? ?05:00: Doing well on CIWA protocol, most recent score 5, CBG is maintaining. Medically cleared. Disposition per [facility].  ? ? ? ?  ?Cherly Anderson, [place] ?04/30/21 2947 ? ?  ?Glendora Score, MD ?04/30/21 [phone] ? ?

## 2021-04-29 NOTE — ED Triage Notes (Signed)
The pt arrived by gems from somewhere the pt  has been drinking alcohol and she smokes crack  which she had today earlier she is in constant motion moving all her extremities rubbing her nose  she reports that she was in rehab in  January and was out in feb and started drinking and using as soon as she was out gpd initially  here with pt but they left ?

## 2021-04-29 NOTE — ED Provider Notes (Signed)
[facility] EMERGENCY DEPARTMENT Provider Note   CSN: [id number] Arrival date & time: 04/29/21  1621     History  Chief Complaint  Patient presents with   Psychiatric Evaluation    [name] is a 36 y.o. female.  HPI Patient is a 36 year old female who presents to the emergency department via EMS due to drug intoxication.  Patient became paranoid after smoking a "bad batch of crack" and called EMS.  Also notes that she drank 1 bottle of wine earlier today.  She reports congestion and auditory hallucinations but otherwise denies any physical complaints.  No SI/HI.  Patient requests to be placed in detox.  Patient extremely tangential and due to this it is difficult to obtain a thorough history.  Level 5 caveat due to acuity of condition    Home Medications Prior to Admission medications   Medication Sig Start Date End Date Taking? Authorizing Provider  acetaminophen (TYLENOL) 500 MG tablet Take 2 tablets (1,000 mg total) by mouth every 6 (six) hours as needed. 10/15/19   Dartha Lodge, PA-C  albuterol (VENTOLIN HFA) 108 (90 Base) MCG/ACT inhaler Inhale 1-2 puffs into the lungs every 6 (six) hours as needed for wheezing or shortness of breath. Patient not taking: Reported on 10/14/2019 11/17/18   Couture, Cortni S, PA-C  amoxicillin-clavulanate (AUGMENTIN) 875-125 MG tablet Take 1 tablet by mouth 2 (two) times daily. One po bid x 7 days 10/15/19   Dartha Lodge, PA-C  azithromycin (ZITHROMAX) 250 MG tablet Take 1 tablet (250 mg total) by mouth daily. Take 1 tablet daily 10/15/19   Dartha Lodge, PA-C  dextromethorphan-guaiFENesin [facility] DM) 30-600 MG 12hr tablet Take 1 tablet by mouth 2 (two) times daily. 10/15/19   Dartha Lodge, PA-C  ibuprofen (ADVIL) 600 MG tablet Take 1 tablet (600 mg total) by mouth every 6 (six) hours as needed. 10/15/19   Dartha Lodge, PA-C      Allergies    Patient has no known allergies.    Review of Systems   Review of  Systems  Unable to perform ROS: Acuity of condition   Physical Exam Updated Vital Signs BP 112/76    Pulse (!) 109    Temp 98.8 F (37.1 C)    Resp 20    Ht 4\' 11"  (1.499 m)    Wt 54.4 kg    LMP 04/29/2021    SpO2 99%    BMI 24.22 kg/m  Physical Exam Vitals and nursing note reviewed.  Constitutional:      General: She is not in acute distress.    Appearance: She is not ill-appearing, toxic-appearing or diaphoretic.  HENT:     Head: Normocephalic and atraumatic.     Right Ear: External ear normal.     Left Ear: External ear normal.     Nose: Nose normal.     Mouth/Throat:     Mouth: Mucous membranes are moist.     Pharynx: Oropharynx is clear. No oropharyngeal exudate or posterior oropharyngeal erythema.  Eyes:     Extraocular Movements: Extraocular movements intact.  Cardiovascular:     Rate and Rhythm: Normal rate and regular rhythm.     Pulses: Normal pulses.     Heart sounds: Normal heart sounds. No murmur heard.   No friction rub. No gallop.  Pulmonary:     Effort: Pulmonary effort is normal. No respiratory distress.     Breath sounds: Normal breath sounds. No stridor. No wheezing, rhonchi  or rales.  Abdominal:     General: Abdomen is flat.     Tenderness: There is no abdominal tenderness.  Musculoskeletal:        General: Normal range of motion.     Cervical back: Normal range of motion and neck supple. No tenderness.  Skin:    General: Skin is warm and dry.  Neurological:     General: No focal deficit present.     Mental Status: She is alert.     Comments: Moving all 4 extremities.  Speaking erratically.  Difficulty answering questions.  Follows commands.  Psychiatric:        Mood and Affect: Mood is anxious.        Behavior: Behavior is hyperactive.        Thought Content: Thought content is paranoid. Thought content does not include homicidal or suicidal ideation.   ED Results / Procedures / Treatments   Labs (all labs ordered are listed, but only abnormal  results are displayed) Labs Reviewed  COMPREHENSIVE METABOLIC PANEL - Abnormal; Notable for the following components:      Result Value   Potassium 3.4 (*)    CO2 19 (*)    Glucose, Bld 61 (*)    Calcium 8.6 (*)    Anion gap 18 (*)    All other components within normal limits  ETHANOL - Abnormal; Notable for the following components:   Alcohol, Ethyl (B) 42 (*)    All other components within normal limits  CBC WITH DIFFERENTIAL/PLATELET - Abnormal; Notable for the following components:   WBC 17.0 (*)    Neutro Abs 13.3 (*)    Monocytes Absolute 1.7 (*)    Abs Immature Granulocytes 0.08 (*)    All other components within normal limits  SALICYLATE LEVEL - Abnormal; Notable for the following components:   Salicylate Lvl <7.0 (*)    All other components within normal limits  ACETAMINOPHEN LEVEL - Abnormal; Notable for the following components:   Acetaminophen (Tylenol), Serum <10 (*)    All other components within normal limits  CBG MONITORING, ED - Abnormal; Notable for the following components:   Glucose-Capillary 64 (*)    All other components within normal limits  RESP PANEL BY RT-PCR (FLU A&B, COVID) ARPGX2  RAPID URINE DRUG SCREEN, HOSP PERFORMED  I-STAT BETA HCG BLOOD, ED ([facility], [facility], [facility] ONLY)  CBG MONITORING, ED   EKG EKG Interpretation  Date/Time:  Sunday April 29 2021 17:47:57 EST Ventricular Rate:  107 PR Interval:  121 QRS Duration: 75 QT Interval:  349 QTC Calculation: 466 R Axis:   74 Text Interpretation: Sinus tachycardia Consider left atrial enlargement Borderline T wave abnormalities Confirmed by Ernie AvenaLawsing, James ([id number]) on 04/29/2021 6:22:10 PM  Radiology CT HEAD WO CONTRAST (5MM)  Result Date: 04/29/2021 CLINICAL DATA:  Recent alcohol and crack cocaine use with altered mental status EXAM: CT HEAD WITHOUT CONTRAST TECHNIQUE: Contiguous axial images were obtained from the base of the skull through the vertex without intravenous contrast. RADIATION DOSE REDUCTION:  This exam was performed according to the departmental dose-optimization program which includes automated exposure control, adjustment of the mA and/or kV according to patient size and/or use of iterative reconstruction technique. COMPARISON:  None. FINDINGS: Brain: No evidence of acute infarction, hemorrhage, hydrocephalus, extra-axial collection or mass lesion/mass effect. Vascular: No hyperdense vessel or unexpected calcification. Skull: Normal. Negative for fracture or focal lesion. Sinuses/Orbits: No acute finding. Other: None. IMPRESSION: No acute intracranial abnormality noted. Electronically Signed   By:  Alcide Clever M.D.   On: 04/29/2021 22:06    Procedures Procedures   Medications Ordered in ED Medications  LORazepam (ATIVAN) injection 0-4 mg (2 mg Intravenous Given 04/29/21 1717)    Or  LORazepam (ATIVAN) tablet 0-4 mg ( Oral See Alternative 04/29/21 1717)  LORazepam (ATIVAN) injection 0-4 mg (has no administration in time range)    Or  LORazepam (ATIVAN) tablet 0-4 mg (has no administration in time range)  thiamine tablet 100 mg (100 mg Oral Not Given 04/29/21 1904)    Or  thiamine (B-1) injection 100 mg ( Intravenous See Alternative 04/29/21 1904)   ED Course/ Medical Decision Making/ A&P                           Medical Decision Making Amount and/or Complexity of Data Reviewed Labs: ordered. Radiology: ordered.  Risk OTC drugs. Prescription drug management.  Pt is a 36 y.o. female who presents to the emergency department due to altered mental status after smoking crack cocaine this afternoon.  Initially presented psychotic, flailing around in stretcher, not providing any clear answers to questions.  Labs: CBC with a white count of 17, neutrophils of 13.3, monocytes of 1.7, absolute immature granulocytes of 0.08. CMP with a potassium of 3.4, CO2 of 19, glucose of 61, ostium of 8.6, anion gap of 18. Ethanol 42. Acetaminophen less than 10. Salicylate less than 7. I-STAT  beta-hCG less than 5.  Imaging: CT scan of the head without contrast shows no acute intracranial abnormality.  I, Placido Sou, PA-C, personally reviewed and evaluated these images and lab results as part of my medical decision-making.  Patient does not appear to have a listed psychiatric history.  Initially flailing around on the stretcher not providing any clear answers to questions.  She was given 2 mg of IV Ativan and her symptoms improved.  Ethanol mildly elevated at 42.  After many hours patient still appears fatigued but is much more responsive and mentation has improved.  States that she "typically drinks about 4, 40s a day".  Last drank earlier today and noted to EMS that she drank a whole bottle of wine.  CBC shows a leukocytosis of 17.  Mild sinus tachycardia.  Currently afebrile.  Unsure the source of the patient's leukocytosis.  Possibly due to her recent crack cocaine use.  She denies any physical complaints to me.  No chest pain, shortness of breath, abdominal pain, nausea, vomiting.  Respiratory panel is negative.  CT scan of the head without contrast shows no acute intracranial abnormalities. She was hypoglycemic at 61 on CMP.  Repeat CBG of 64.  Patient given food and drink with a repeat CBG of 139.  Denies any history of diabetes mellitus and denies being on any regular medications.  Given patient has no listed psychiatric history, feel that she would benefit from TTS evaluation.  Unfortunately, our TTS services are not available overnight tonight so patient will need to board in the emergency department until the morning where she will then be evaluated by TTS.  Given her drinking history will continue with CIWA protocol overnight.  Patient care is being transferred to [facility].  Disposition pending based on TTS recommendations tomorrow morning.  Note: Portions of this report may have been transcribed using voice recognition software. Every effort was made to ensure  accuracy; however, inadvertent computerized transcription errors may be present.   Final Clinical Impression(s) / ED Diagnoses Final diagnoses:  Polysubstance  abuse (HCC)  Alcohol abuse  Altered mental status, unspecified altered mental status type   Rx / DC Orders ED Discharge Orders     None         Placido Sou, PA-C 04/29/21 2322    Ernie Avena, MD 04/29/21 2353

## 2021-04-30 LAB — RAPID URINE DRUG SCREEN, HOSP PERFORMED
Amphetamines: NOT DETECTED
Barbiturates: NOT DETECTED
Benzodiazepines: POSITIVE — AB
Cocaine: POSITIVE — AB
Opiates: NOT DETECTED
Tetrahydrocannabinol: POSITIVE — AB

## 2021-04-30 LAB — CBG MONITORING, ED
Glucose-Capillary: 86 mg/dL (ref 70–99)
Glucose-Capillary: 84 mg/dL (ref 70–99)
Glucose-Capillary: 77 mg/dL (ref 70–99)

## 2021-04-30 NOTE — ED Notes (Signed)
Pt was wanded by security.  

## 2021-04-30 NOTE — ED Notes (Signed)
Pt sleeping. 

## 2021-04-30 NOTE — ED Notes (Signed)
Pt belongings placed in locker 12 on shelf two  ?

## 2021-04-30 NOTE — ED Notes (Signed)
Pt aware we need a urine sample. 

## 2021-04-30 NOTE — ED Notes (Signed)
The pt reports that she is on her period  very bloody urine ?

## 2021-04-30 NOTE — ED Notes (Signed)
Pt wakes up then goes back to sleep ?

## 2021-04-30 NOTE — ED Notes (Signed)
Pt wanded by security. 

## 2021-05-01 NOTE — Discharge Instructions (Addendum)
It was our pleasure to provide your ER care today - we hope that you feel better. ? ?Avoid alcohol and substance use, as it is harmful to your physical health and mental well-being.  See resource guide provided for pursuing substance use treatment program.  ? ?Follow up with primary care doctor in the next 1-2 weeks.  ? ?For mental health issues and/or crisis, you may also go directly to the [facility] - it is open 24/7 and walk-ins are welcome. ? ?From your labs, your potassium level is slightly low - eat plenty of fruits and vegetables, and follow up with primary care doctor.  ? ?Return to ER if worse, new symptoms, fevers, chest pain, trouble breathing, or other concern.  ?

## 2021-05-01 NOTE — ED Notes (Signed)
Breakfast Order placed ?

## 2021-05-01 NOTE — ED Provider Notes (Signed)
Emergency Medicine Observation Re-evaluation Note ? ?[name] is a 36 y.o. female, seen on rounds today.  Pt initially presented to the ED for complaints of Psychiatric Evaluation ?Currently, the patient is asleep.  She is still awaiting TTS eval. ? ?Physical Exam  ?BP 105/86   Pulse 89   Temp 99 ?F (37.2 ?C)   Resp 19   Ht 4\' 11"  (1.499 m)   Wt 54.4 kg   LMP 04/29/2021   SpO2 98%   BMI 24.22 kg/m?  ?Physical Exam ?General: asleep ?Cardiac: rrr ?Lungs: cta b ?Psych: asleep ? ?ED Course / MDM  ?EKG:EKG Interpretation ? ?Date/Time:  Sunday April 29 2021 17:47:57 EST ?Ventricular Rate:  107 ?PR Interval:  121 ?QRS Duration: 75 ?QT Interval:  349 ?QTC Calculation: 466 ?R Axis:   74 ?Text Interpretation: Sinus tachycardia Consider left atrial enlargement Borderline T wave abnormalities Confirmed by [id number] ([id number]) on 04/29/2021 6:22:10 PM ? ?I have reviewed the labs performed to date as well as medications administered while in observation.  Recent changes in the last 24 hours include no evidence of alcohol withdrawal.  Vitals nl this am.  She is medically clear. ? ?Plan  ?Current plan is for TTS eval. ? [name] is not under involuntary commitment. ? ? ?  ?Madison Memos, MD ?05/01/21 [phone] ? ?

## 2021-05-01 NOTE — ED Notes (Signed)
Pt given belongings back, safety plan discussed, pt is a/ox4, agreeable to d/c plan.  ?

## 2021-05-01 NOTE — ED Provider Notes (Signed)
Emergency Medicine Observation Re-evaluation Note ? ?[name] is a 36 y.o. female, seen on rounds today.  Pt initially presented to the ED for complaints of behavioral health-type symptoms and substance use. Pt reports feeling much improved. Is calm and cooperative, conversant, normal appetite. Denies any current physical or behavioral health complaint.  ? ?Physical Exam  ?BP (!) 97/55   Pulse 79   Temp 98.5 ?F (36.9 ?C) (Oral)   Resp 18   Ht 1.499 m (4\' 11" )   Wt 54.4 kg   LMP 04/29/2021   SpO2 99%   BMI 24.22 kg/m?  ?Physical Exam ?General: alert, content, no distress.  ?Cardiac: regular rate.  ?Lungs: breathing comfortably. ?Psych: alert, content. Pt has normal mood and affect. Is pleasant and cooperative. Does not appear depressed. No thoughts of harm to self or others. Pt is not responding to internal stimuli - no hallucinations, delusions or acute psychosis noted.  ? ?ED Course / MDM  ? ? ?I have reviewed the labs performed to date as well as medications administered while in observation.  Recent changes in the last 24 hours include ED obs, metabolism of substances, and reassessment.  ? ?Plan  ? ? [name] is not under involuntary commitment. ? ?Earlier symptoms possibly due to substance use and related mood disorder. Currently pt reports feeling much improved. No depression or acute psychosis noted.  Pt indicates feels ready for d/c.  ? ?Pt appears stable for d/c.  ? ?Rec pcp f/u. ? ?Return precautions provided.  ? ? ? ? ?  ?Ermalinda Memos, MD ?05/01/21 1339 ? ?

## 2022-07-20 ENCOUNTER — Encounter (HOSPITAL_COMMUNITY): Payer: Self-pay

## 2022-07-20 ENCOUNTER — Inpatient Hospital Stay (HOSPITAL_COMMUNITY)
Admission: EM | Admit: 2022-07-20 | Discharge: 2022-07-27 | DRG: 855 | Disposition: A | Discharge status: Home / Self Care | Payer: Self-pay | Attending: Internal Medicine | Admitting: Internal Medicine

## 2022-07-20 ENCOUNTER — Emergency Department (HOSPITAL_COMMUNITY): Payer: Self-pay

## 2022-07-20 DIAGNOSIS — A4902 Methicillin resistant Staphylococcus aureus infection, unspecified site: Secondary | ICD-10-CM | POA: Insufficient documentation

## 2022-07-20 DIAGNOSIS — Z89022 Acquired absence of left finger(s): Secondary | ICD-10-CM

## 2022-07-20 DIAGNOSIS — M659 Synovitis and tenosynovitis, unspecified: Principal | ICD-10-CM

## 2022-07-20 DIAGNOSIS — D72829 Elevated white blood cell count, unspecified: Secondary | ICD-10-CM | POA: Diagnosis present

## 2022-07-20 DIAGNOSIS — F1210 Cannabis abuse, uncomplicated: Secondary | ICD-10-CM | POA: Diagnosis present

## 2022-07-20 DIAGNOSIS — D75839 Thrombocytosis, unspecified: Secondary | ICD-10-CM | POA: Diagnosis present

## 2022-07-20 DIAGNOSIS — L089 Local infection of the skin and subcutaneous tissue, unspecified: Secondary | ICD-10-CM | POA: Diagnosis present

## 2022-07-20 DIAGNOSIS — F1410 Cocaine abuse, uncomplicated: Secondary | ICD-10-CM | POA: Diagnosis present

## 2022-07-20 DIAGNOSIS — A4102 Sepsis due to Methicillin resistant Staphylococcus aureus: Principal | ICD-10-CM | POA: Diagnosis present

## 2022-07-20 DIAGNOSIS — R7401 Elevation of levels of liver transaminase levels: Secondary | ICD-10-CM | POA: Diagnosis present

## 2022-07-20 DIAGNOSIS — F1910 Other psychoactive substance abuse, uncomplicated: Secondary | ICD-10-CM | POA: Diagnosis present

## 2022-07-20 DIAGNOSIS — T63301A Toxic effect of unspecified spider venom, accidental (unintentional), initial encounter: Secondary | ICD-10-CM | POA: Diagnosis present

## 2022-07-20 DIAGNOSIS — F17210 Nicotine dependence, cigarettes, uncomplicated: Secondary | ICD-10-CM | POA: Diagnosis present

## 2022-07-20 HISTORY — DX: Other psychoactive substance abuse, uncomplicated: F19.10

## 2022-07-20 LAB — C-REACTIVE PROTEIN: CRP: 6.9 mg/dL — ABNORMAL HIGH (ref <1.0)

## 2022-07-20 LAB — CBC WITH DIFFERENTIAL/PLATELET
Abs Immature Granulocytes: 0.05 10*3/uL (ref 0.00–0.07)
Basophils Absolute: 0 10*3/uL (ref 0.0–0.1)
Basophils Relative: 0 %
Eosinophils Absolute: 0.2 10*3/uL (ref 0.0–0.5)
Eosinophils Relative: 1 %
HCT: 38.1 % (ref 36.0–46.0)
Hemoglobin: 12.7 g/dL (ref 12.0–15.0)
Immature Granulocytes: 0 %
Lymphocytes Relative: 16 %
Lymphs Abs: 2.2 10*3/uL (ref 0.7–4.0)
MCH: 30.1 pg (ref 26.0–34.0)
MCHC: 33.3 g/dL (ref 30.0–36.0)
MCV: 90.3 fL (ref 80.0–100.0)
Monocytes Absolute: 2 10*3/uL — ABNORMAL HIGH (ref 0.1–1.0)
Monocytes Relative: 15 %
Neutro Abs: 8.8 10*3/uL — ABNORMAL HIGH (ref 1.7–7.7)
Neutrophils Relative %: 68 %
Platelets: 406 10*3/uL — ABNORMAL HIGH (ref 150–400)
RBC: 4.22 MIL/uL (ref 3.87–5.11)
RDW: 13.9 % (ref 11.5–15.5)
WBC: 13.2 10*3/uL — ABNORMAL HIGH (ref 4.0–10.5)
nRBC: 0 % (ref 0.0–0.2)

## 2022-07-20 LAB — RAPID URINE DRUG SCREEN, HOSP PERFORMED
Amphetamines: NOT DETECTED
Barbiturates: NOT DETECTED
Benzodiazepines: NOT DETECTED
Cocaine: POSITIVE — AB
Opiates: POSITIVE — AB
Tetrahydrocannabinol: POSITIVE — AB

## 2022-07-20 LAB — HIV ANTIBODY (ROUTINE TESTING W REFLEX): HIV Screen 4th Generation wRfx: NONREACTIVE

## 2022-07-20 LAB — LACTIC ACID, PLASMA
Lactic Acid, Venous: 0.8 mmol/L (ref 0.5–1.9)
Lactic Acid, Venous: 0.7 mmol/L (ref 0.5–1.9)

## 2022-07-20 LAB — BASIC METABOLIC PANEL
Anion gap: 14 (ref 5–15)
BUN: 13 mg/dL (ref 6–20)
CO2: 21 mmol/L — ABNORMAL LOW (ref 22–32)
Calcium: 9.4 mg/dL (ref 8.9–10.3)
Chloride: 102 mmol/L (ref 98–111)
Creatinine, Ser: 0.83 mg/dL (ref 0.44–1.00)
GFR, Estimated: >60 mL/min (ref >60)
Glucose, Bld: 78 mg/dL (ref 70–99)
Potassium: 4.5 mmol/L (ref 3.5–5.1)
Sodium: 137 mmol/L (ref 135–145)

## 2022-07-20 LAB — HEPATITIS PANEL, ACUTE
HCV Ab: NONREACTIVE
Hep A IgM: NONREACTIVE
Hep B C IgM: NONREACTIVE
Hepatitis B Surface Ag: NONREACTIVE

## 2022-07-20 LAB — ETHANOL: Alcohol, Ethyl (B): <10 mg/dL (ref <10)

## 2022-07-20 LAB — HEPATIC FUNCTION PANEL
ALT: 135 U/L — ABNORMAL HIGH (ref 0–44)
AST: 103 U/L — ABNORMAL HIGH (ref 15–41)
Albumin: 3 g/dL — ABNORMAL LOW (ref 3.5–5.0)
Alkaline Phosphatase: 109 U/L (ref 38–126)
Bilirubin, Direct: <0.1 mg/dL (ref 0.0–0.2)
Total Bilirubin: 0.4 mg/dL (ref 0.3–1.2)
Total Protein: 6 g/dL — ABNORMAL LOW (ref 6.5–8.1)

## 2022-07-20 LAB — SEDIMENTATION RATE: Sed Rate: 50 mm/hr — ABNORMAL HIGH (ref 0–22)

## 2022-07-20 LAB — ACETAMINOPHEN LEVEL: Acetaminophen (Tylenol), Serum: <10 ug/mL — ABNORMAL LOW (ref 10–30)

## 2022-07-20 MED ORDER — LORAZEPAM 2 MG/ML IJ SOLN
1.0000 mg | Freq: Once | INTRAMUSCULAR | Status: AC
  Administered 2022-07-20: 1 mg via INTRAVENOUS
  Filled 2022-07-20: qty 1

## 2022-07-20 MED ORDER — ACETAMINOPHEN 650 MG RE SUPP: 650.0000 mg | Freq: Four times a day (QID) | RECTAL | Status: DC | PRN

## 2022-07-20 MED ORDER — MELOXICAM 7.5 MG PO TABS
15.0000 mg | ORAL_TABLET | Freq: Every day | ORAL | Status: DC
  Administered 2022-07-22 – 2022-07-27 (×6): 15 mg via ORAL
  Filled 2022-07-20 (×8): qty 2

## 2022-07-20 MED ORDER — PIPERACILLIN-TAZOBACTAM 3.375 G IVPB
3.3750 g | Freq: Three times a day (TID) | INTRAVENOUS | Status: DC
  Administered 2022-07-20 – 2022-07-23 (×9): 3.375 g via INTRAVENOUS
  Filled 2022-07-20 (×9): qty 50

## 2022-07-20 MED ORDER — MORPHINE SULFATE (PF) 4 MG/ML IV SOLN
4.0000 mg | Freq: Once | INTRAVENOUS | Status: AC
  Administered 2022-07-20: 4 mg via INTRAVENOUS
  Filled 2022-07-20: qty 1

## 2022-07-20 MED ORDER — LORAZEPAM 2 MG/ML IJ SOLN: 0.5000 mg | Freq: Four times a day (QID) | INTRAMUSCULAR | Status: DC | PRN

## 2022-07-20 MED ORDER — VANCOMYCIN HCL IN DEXTROSE 1-5 GM/200ML-% IV SOLN
1000.0000 mg | Freq: Once | INTRAVENOUS | Status: AC
Start: 2022-07-20 — End: 2022-07-20
  Administered 2022-07-20: 1000 mg via INTRAVENOUS
  Filled 2022-07-20: qty 200

## 2022-07-20 MED ORDER — VANCOMYCIN HCL IN DEXTROSE 1-5 GM/200ML-% IV SOLN
1000.0000 mg | INTRAVENOUS | Status: DC
  Administered 2022-07-21 – 2022-07-25 (×5): 1000 mg via INTRAVENOUS
  Filled 2022-07-20 (×5): qty 200

## 2022-07-20 MED ORDER — SODIUM CHLORIDE 0.9% FLUSH
3.0000 mL | Freq: Two times a day (BID) | INTRAVENOUS | Status: DC
  Administered 2022-07-20 – 2022-07-25 (×10): 3 mL via INTRAVENOUS

## 2022-07-20 MED ORDER — ALBUTEROL SULFATE (2.5 MG/3ML) 0.083% IN NEBU: 2.5000 mg | INHALATION_SOLUTION | Freq: Four times a day (QID) | RESPIRATORY_TRACT | Status: DC | PRN

## 2022-07-20 MED ORDER — SODIUM CHLORIDE 0.9 % IV BOLUS
1000.0000 mL | Freq: Once | INTRAVENOUS | Status: AC
  Administered 2022-07-20: 1000 mL via INTRAVENOUS

## 2022-07-20 MED ORDER — ONDANSETRON HCL 4 MG PO TABS: 4.0000 mg | ORAL_TABLET | Freq: Four times a day (QID) | ORAL | Status: DC | PRN

## 2022-07-20 MED ORDER — LORAZEPAM 2 MG/ML IJ SOLN
0.2500 mg | Freq: Four times a day (QID) | INTRAMUSCULAR | Status: DC | PRN
  Administered 2022-07-20: 0.25 mg via INTRAVENOUS
  Filled 2022-07-20: qty 1

## 2022-07-20 MED ORDER — MORPHINE SULFATE (PF) 2 MG/ML IV SOLN
1.0000 mg | Freq: Once | INTRAVENOUS | Status: AC
  Administered 2022-07-20: 1 mg via INTRAVENOUS
  Filled 2022-07-20: qty 1

## 2022-07-20 MED ORDER — ACETAMINOPHEN 325 MG PO TABS: 650.0000 mg | ORAL_TABLET | Freq: Four times a day (QID) | ORAL | Status: DC | PRN

## 2022-07-20 MED ORDER — ONDANSETRON HCL 4 MG/2ML IJ SOLN: 4.0000 mg | Freq: Four times a day (QID) | INTRAMUSCULAR | Status: DC | PRN

## 2022-07-20 MED ORDER — OXYCODONE HCL 5 MG PO TABS
5.0000 mg | ORAL_TABLET | Freq: Four times a day (QID) | ORAL | Status: DC | PRN
  Administered 2022-07-20 – 2022-07-27 (×21): 5 mg via ORAL
  Filled 2022-07-20 (×23): qty 1

## 2022-07-20 MED ORDER — PIPERACILLIN-TAZOBACTAM 3.375 G IVPB 30 MIN
3.3750 g | Freq: Once | INTRAVENOUS | Status: AC
  Administered 2022-07-20: 3.375 g via INTRAVENOUS
  Filled 2022-07-20: qty 50

## 2022-07-20 NOTE — Consult Note (Signed)
ORTHOPAEDIC CONSULTATION HISTORY & PHYSICAL REQUESTING PHYSICIAN: Dixon, Ryan, MD  Chief Complaint: left small finger pain and swelling  HPI: [name] is a 37 y.o. female IVDU who presented to the emergency department with pain and swelling of the left small finger.  She reports that it started at the tip and has progressed over the course of just a few days.  It is now painful and swollen throughout, largely nearly fully extended.  She denies any known puncture of the digit  History reviewed. No pertinent past medical history. Past Surgical History:  Procedure Laterality Date   AMPUTATION Left 08/30/2016   Procedure: REVISION OF LEFT LONG FINGER AMPUTATION;  Surgeon: Shley Dolby, MD;  Location: [facility] OR;  Service: Orthopedics;  Laterality: Left;   CESAREAN SECTION     Social History   Socioeconomic History   Marital status: Single    Spouse name: Not on file   Number of children: Not on file   Years of education: Not on file   Highest education level: Not on file  Occupational History   Not on file  Tobacco Use   Smoking status: Every Day    Packs/day: 1    Types: Cigarettes   Smokeless tobacco: Never  Substance and Sexual Activity   Alcohol use: Yes    Comment: daily-- close to a fifth and 7 beers (16 ounce)   Drug use: Yes    Types: Cocaine   Sexual activity: Not on file  Other Topics Concern   Not on file  Social History Narrative   Not on file   Social Determinants of Health   Financial Resource Strain: Not on file  Food Insecurity: Not on file  Transportation Needs: Not on file  Physical Activity: Not on file  Stress: Not on file  Social Connections: Not on file   History reviewed. No pertinent family history. No Known Allergies Prior to Admission medications   Medication Sig Start Date End Date Taking? Authorizing Provider  acetaminophen (TYLENOL) 500 MG tablet Take 2 tablets (1,000 mg total) by mouth every 6 (six) hours as needed. 10/15/19  Yes  Ford, Kelsey N, PA-C  ibuprofen (ADVIL) 200 MG tablet Take 800 mg by mouth every 6 (six) hours as needed for mild pain.   Yes [provider]   DG Finger Little Left  Result Date: 07/20/2022 CLINICAL DATA:  Infection.  Left small finger spider bite. EXAM: LEFT FINGER(S) - 2+ VIEW COMPARISON:  Left hand x-rays dated August 30, 2016. FINDINGS: Severe soft tissue swelling of the small finger. No bony destruction or periosteal reaction. No acute fracture or dislocation. Joint spaces are preserved. Bone mineralization is normal. IMPRESSION: 1. Severe soft tissue swelling of the small finger. No acute osseous abnormality. Electronically Signed   By: William T Derry M.D.   On: 07/20/2022 09:10    Positive ROS: All other systems have been reviewed and were otherwise negative with the exception of those mentioned in the HPI and as above.  Physical Exam: Vitals: Refer to EMR. Constitutional:  WD, WN, NAD HEENT:  NCAT, EOMI Neuro/Psych:  Alert & oriented to person, place, and time; appropriate mood & affect Lymphatic: No generalized extremity edema or lymphadenopathy Extremities / MSK:  The extremities are normal with respect to appearance, ranges of motion, joint stability, muscle strength/tone, sensation, & perfusion except as otherwise noted:  Refer to clinical photos.  The small finger on the left side rests nearly fully extended and is significantly swollen throughout the volar   and dorsal surfaces.  The skin at the tip is a little firm, but there is no distinct blistering.  It is tender throughout both volar and dorsal.  There is no visible open wound or expressible drainage.  There is minimal if any swelling proximal to the proximal digital crease and no tenderness in the other digits.  Assessment: Left small finger presumed infection of several days duration, without acknowledged history of a puncture wound or other direct skin violation.  Recommendations: Labs are presently pending.  Her  condition is significant enough that I recommend she be admitted for broad-spectrum parenteral IV antibiotics for presumed finger infection.    Please do not prescribe pharmacological anticoagulants, as hand surgery will continue to follow and unless significant improvement occurs through the course of the day and night, plan for surgical exploration and drainage tomorrow.  She should be n.p.o. after midnight to accommodate early evaluation in the morning and decision upon proceeding or deferring surgery.  Laquilla Dault A. Ashawna Hanback, MD      Orthopaedic & Hand Surgery [facility] [address] [place], [place]  [address] Office: [phone]  07/20/2022, 9:20 AM    

## 2022-07-20 NOTE — ED Provider Notes (Signed)
[place] EMERGENCY DEPARTMENT AT [facility] Provider Note   CSN: [id number] Arrival date & time: 07/20/22  4098     History No chief complaint on file.   [name] is a 37 y.o. female presenting today with a left finger swelling.  This has been going on for a couple of days.  She was unsure whether or not something bit her but her pain and swelling is getting worse.  She says that she is "popping Tylenol."  Believes that she has had 5 Tylenol today.  Of note she was a polysubstance user and says that she uses crack cocaine.  Last use last night.  Also is a diabetic but does not check her sugars.  Reports having limited range of motion, subjective fevers and chills   HPI     Home Medications Prior to Admission medications   Medication Sig Start Date End Date Taking? Authorizing Provider  acetaminophen (TYLENOL) 500 MG tablet Take 2 tablets (1,000 mg total) by mouth every 6 (six) hours as needed. 10/15/19   Dartha Lodge, PA-C  ibuprofen (ADVIL) 200 MG tablet Take 800 mg by mouth every 6 (six) hours as needed for mild pain.    [provider]      Allergies    Patient has no known allergies.    Review of Systems   Review of Systems  Physical Exam Updated Vital Signs There were no vitals taken for this visit. Physical Exam Vitals and nursing note reviewed.  Constitutional:      Appearance: Normal appearance.  HENT:     Head: Normocephalic and atraumatic.  Eyes:     General: No scleral icterus.    Conjunctiva/sclera: Conjunctivae normal.  Pulmonary:     Effort: Pulmonary effort is normal. No respiratory distress.  Musculoskeletal:     Comments: Photograph below.  Patient is holding her digit in a slightly flexed position.  She is able to range the MCP however unable to range the PIP and DIP.  No drainage however does have some superficial scaling to the palmar surface of the digit.  The digit is warm and there is erythema streaking up her  forearm.  Skin:    Findings: No rash.  Neurological:     Mental Status: She is alert.  Psychiatric:     Comments: Patient very anxious and thrashing around on the stretcher.  Appears acutely intoxicated with stimulant            ED Results / Procedures / Treatments   Labs (all labs ordered are listed, but only abnormal results are displayed) Labs Reviewed - No data to display  EKG None  Radiology DG Finger Little Left  Result Date: 07/20/2022 CLINICAL DATA:  Infection.  Left small finger spider bite. EXAM: LEFT FINGER(S) - 2+ VIEW COMPARISON:  Left hand x-rays dated August 30, 2016. FINDINGS: Severe soft tissue swelling of the small finger. No bony destruction or periosteal reaction. No acute fracture or dislocation. Joint spaces are preserved. Bone mineralization is normal. IMPRESSION: 1. Severe soft tissue swelling of the small finger. No acute osseous abnormality. Electronically Signed   By: Obie Dredge M.D.   On: 07/20/2022 09:10    Procedures Procedures   Medications Ordered in ED Medications  vancomycin (VANCOCIN) IVPB 1000 mg/200 mL premix (1,000 mg Intravenous New Bag/Given 07/20/22 1020)  meloxicam (MOBIC) tablet 15 mg (has no administration in time range)  piperacillin-tazobactam (ZOSYN) IVPB 3.375 g (has no administration in time range)  vancomycin (VANCOCIN) IVPB 1000 mg/200 mL premix (has no administration in time range)  morphine (PF) 4 MG/ML injection 4 mg (4 mg Intravenous Given 07/20/22 0901)  piperacillin-tazobactam (ZOSYN) IVPB 3.375 g (3.375 g Intravenous New Bag/Given 07/20/22 0902)  LORazepam (ATIVAN) injection 1 mg (1 mg Intravenous Given 07/20/22 1016)    ED Course/ Medical Decision Making/ A&P Clinical Course as of 07/20/22 1020  Sat Jul 20, 2022  1013 Sleeping comfortably [MR]    Clinical Course User Index [MR] Shivank Pinedo, Gabriel Cirri, PA-C                             Medical Decision Making Amount and/or Complexity of Data Reviewed Labs:  ordered. Radiology: ordered.  Risk Prescription drug management. Decision regarding hospitalization.   37 year old female presenting today with swelling and pain to the digit.  Considerations include but are not limited to flexor tenosynovitis, Cellulitis, paronychia, felon, herpetic whitlow  This is not an exhaustive differential.    Past Medical History / Co-morbidities / Social History: Polysubstance use and diabetes   Physical Exam: Pertinent physical exam findings include Swollen, erythematous, tender and warm digit.  Held in flexion  Lab Tests: I ordered, and personally interpreted labs.  The pertinent results include: CRP 6.9 WBC 13.2 Negative Tylenol level   Imaging Studies: I ordered and independently visualized and interpreted x-ray and I agree with the radiologist that there is severe soft tissue swelling.  This was noted on physical exam     Medications: Morphine for pain, Vanco and Zosyn for infection  Consultations Obtained: I spoke with Dr. Janee Morn with hand surgery.  He recommends that we admit the patient and he will follow her exam.  MDM/Disposition: This is a 36 year old female presenting today with inflammation, warmth and swelling to the left fifth digit.  Has been going on for a couple of days.  Clinically there is immediate concern for flexor tenosynovitis.  Patient had all 4 Kanavel's signs to include swelling, resting flexion, pain with palpation and pain with extension.  Patient was immediately started on broad-spectrum antibiotics to include Vanco and Zosyn due to diabetes and polysubstance use.  She is tachycardic however this also could be in the setting of recent crack cocaine use.  Dr. Janee Morn was consulted with hand surgery.  He recommends admission and he will follow the patient and her exam.  We will start IV antibiotics.  Due to patient's clear current intoxication she is likely not appropriate for OR at this time anyways.  Patient is agreeable  to admission.  Admit to Dr. Katrinka Blazing   Final Clinical Impression(s) / ED Diagnoses Final diagnoses:  Flexor tenosynovitis of finger    Rx / DC Orders ED Discharge Orders     None       I discussed this case with my attending physician Dr. Durwin Nora who cosigned this note including patient's presenting symptoms, physical exam, and planned diagnostics and interventions. Attending physician stated agreement with plan or made changes to plan which were implemented.      Saddie Benders, PA-C 07/20/22 1055    Gloris Manchester, MD 07/21/22 [phone]

## 2022-07-20 NOTE — H&P (View-Only) (Signed)
ORTHOPAEDIC CONSULTATION HISTORY & PHYSICAL REQUESTING PHYSICIAN: Gloris Manchester, MD  Chief Complaint: left small finger pain and swelling  HPI: [name] is a 37 y.o. female IVDU who presented to the emergency department with pain and swelling of the left small finger.  She reports that it started at the tip and has progressed over the course of just a few days.  It is now painful and swollen throughout, largely nearly fully extended.  She denies any known puncture of the digit  History reviewed. No pertinent past medical history. Past Surgical History:  Procedure Laterality Date   AMPUTATION Left 08/30/2016   Procedure: REVISION OF LEFT LONG FINGER AMPUTATION;  Surgeon: Mack Hook, MD;  Location: [facility] OR;  Service: Orthopedics;  Laterality: Left;   CESAREAN SECTION     Social History   Socioeconomic History   Marital status: Single    Spouse name: Not on file   Number of children: Not on file   Years of education: Not on file   Highest education level: Not on file  Occupational History   Not on file  Tobacco Use   Smoking status: Every Day    Packs/day: 1    Types: Cigarettes   Smokeless tobacco: Never  Substance and Sexual Activity   Alcohol use: Yes    Comment: daily-- close to a fifth and 7 beers (16 ounce)   Drug use: Yes    Types: Cocaine   Sexual activity: Not on file  Other Topics Concern   Not on file  Social History Narrative   Not on file   Social Determinants of Health   Financial Resource Strain: Not on file  Food Insecurity: Not on file  Transportation Needs: Not on file  Physical Activity: Not on file  Stress: Not on file  Social Connections: Not on file   History reviewed. No pertinent family history. No Known Allergies Prior to Admission medications   Medication Sig Start Date End Date Taking? Authorizing Provider  acetaminophen (TYLENOL) 500 MG tablet Take 2 tablets (1,000 mg total) by mouth every 6 (six) hours as needed. 10/15/19  Yes  Dartha Lodge, PA-C  ibuprofen (ADVIL) 200 MG tablet Take 800 mg by mouth every 6 (six) hours as needed for mild pain.   Yes [provider]   DG Finger Little Left  Result Date: 07/20/2022 CLINICAL DATA:  Infection.  Left small finger spider bite. EXAM: LEFT FINGER(S) - 2+ VIEW COMPARISON:  Left hand x-rays dated August 30, 2016. FINDINGS: Severe soft tissue swelling of the small finger. No bony destruction or periosteal reaction. No acute fracture or dislocation. Joint spaces are preserved. Bone mineralization is normal. IMPRESSION: 1. Severe soft tissue swelling of the small finger. No acute osseous abnormality. Electronically Signed   By: Obie Dredge M.D.   On: 07/20/2022 09:10    Positive ROS: All other systems have been reviewed and were otherwise negative with the exception of those mentioned in the HPI and as above.  Physical Exam: Vitals: Refer to EMR. Constitutional:  WD, WN, NAD HEENT:  NCAT, EOMI Neuro/Psych:  Alert & oriented to person, place, and time; appropriate mood & affect Lymphatic: No generalized extremity edema or lymphadenopathy Extremities / MSK:  The extremities are normal with respect to appearance, ranges of motion, joint stability, muscle strength/tone, sensation, & perfusion except as otherwise noted:  Refer to clinical photos.  The small finger on the left side rests nearly fully extended and is significantly swollen throughout the volar  and dorsal surfaces.  The skin at the tip is a little firm, but there is no distinct blistering.  It is tender throughout both volar and dorsal.  There is no visible open wound or expressible drainage.  There is minimal if any swelling proximal to the proximal digital crease and no tenderness in the other digits.  Assessment: Left small finger presumed infection of several days duration, without acknowledged history of a puncture wound or other direct skin violation.  Recommendations: Labs are presently pending.  Her  condition is significant enough that I recommend she be admitted for broad-spectrum parenteral IV antibiotics for presumed finger infection.    Please do not prescribe pharmacological anticoagulants, as hand surgery will continue to follow and unless significant improvement occurs through the course of the day and night, plan for surgical exploration and drainage tomorrow.  She should be n.p.o. after midnight to accommodate early evaluation in the morning and decision upon proceeding or deferring surgery.  Cliffton Asters Janee Morn, MD      Orthopaedic & Hand Surgery [facility]edic [facility] [address] [place], [place]  [address] Office: [phone]  07/20/2022, 9:20 AM

## 2022-07-20 NOTE — H&P (Signed)
History and Physical    Patient: [name] XBJ:[id number] DOB: 1985/06/03 DOA: 07/20/2022 DOS: the patient was seen and examined on 07/20/2022 PCP: [employer], User, MD (Inactive)  Patient coming from: Home  Chief Complaint:  Chief Complaint  Patient presents with   Insect Bite   HPI: [name] is a 37 y.o. 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  History is limited from the patient as she is currently sedated after receiving IV Ativan.  She reports that she thinks a spider bit her finger.  Over the last couple of days she reported progressive swelling starting at the tip and traveling down the left little finger.  Any kind of movement makes pain worse.  Patient with a prior amputation by hand surgery of the distal aspect of the left long finger after reportedly slamming it in a door back in 2018.  Patient denied any IV drug abuse.   In the ED patient was noted to be afebrile with tachycardia and tachypnea.  Labs significant for WBC 13.2, platelets 406, CRP 6.9, and lactic acid 0.8.  Blood cultures have been obtained.  X-rays of the left little finger noted severe soft tissue swelling without acute osseous abnormality appreciated.  Patient has been given 1 L of normal saline IV fluids, vancomycin, Zosyn, meloxicam 15 mg po, Ativan 1 mg IV, and IV morphine for pain.  Hand surgery had been been formally consulted and recommended keeping patient n.p.o. after midnight for possible need of surgery in a.m.   Review of Systems: unable to review all systems due to the inability of the patient to answer questions. History reviewed. No pertinent past medical history. Past Surgical History:  Procedure Laterality Date   AMPUTATION Left 08/30/2016   Procedure: REVISION OF LEFT LONG FINGER AMPUTATION;  Surgeon: Mack Hook, MD;  Location: [facility] OR;  Service: Orthopedics;  Laterality: Left;   CESAREAN SECTION     Social History:  reports that she has been smoking cigarettes. She has been smoking an average of 1 pack per day. She has never used smokeless tobacco. She reports current alcohol use. She reports current drug use. Drug: Cocaine.  No Known Allergies  History reviewed. No pertinent family history.  Prior to Admission medications   Medication Sig Start Date End Date Taking? Authorizing Provider  acetaminophen (TYLENOL) 500 MG tablet Take 2 tablets (1,000 mg total) by mouth every 6 (six) hours as needed. 10/15/19  Yes Dartha Lodge, PA-C  ibuprofen (ADVIL) 200 MG tablet Take 800 mg by mouth every 6 (six) hours as needed for mild pain.   Yes [provider]    Physical Exam: Vitals:   07/20/22 0814 07/20/22 0900  BP: (!) 147/132   Pulse: (!) 104   Resp: (!) 22   Temp: 97.7 F (36.5 C)   TempSrc: Oral   SpO2: 100%   Weight:  56.7 kg  Height:  4\' 11"  (1.499 m)   Constitutional: Middle-age female who is currently lethargic but arousable with verbal stimuli temporarily Eyes: PERRL, lids and conjunctivae normal ENMT: Mucous membranes are dry.   Neck: normal, supple  Respiratory: clear to auscultation bilaterally, no wheezing, no crackles. No accessory muscle use.  Cardiovascular: Regular rate and rhythm, no murmurs / rubs / gallops. No lower extremity edema. 2+ pedal pulses.  Abdomen: no tenderness, no masses palpated.  Bowel sounds positive.  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  Swelling of the left little finger nearly in full extension  Skin: Redness and bruising noted of the left small finger.  No significant track marks appreciated.    Neurologic: CN 2-12 grossly intact.   Strength 5/5 in all 4.  Psychiatric: Lethargic oriented to person and place.  Data Reviewed:  Reviewed labs, imaging, and pertinent records as noted above in HPI.  Assessment and  Plan: Left little finger infection Acute.  Patient presents with progressive swelling and pain of the left little finger over the last couple of days.  Labs significant for WBC 13.2 and CRP 6.9.  X-ray imaging showed no acute osseous abnormality.  Blood cultures have been obtained.  Patient has been placed on empiric antibiotics of vancomycin and Zosyn.  Question tensor synovitis -Admit to a medical telemetry bed -Follow-up blood cultures -N.p.o. after midnight for possible need of surgery in a.m. -Continue empiric antibiotics of vancomycin and Zosyn -Oxycodone p.o. as needed for severe pain  Leukocytosis SIRS Acute.  WBC elevated at 13.2.  Patient was noted to be tachycardic and tachypneic meeting SIRS criteria.  Lactic acid noted to be within normal limits on two separate checks.  Question if patient may be withdrawing. -Recheck CBC tomorrow morning  Thrombocytosis Acute.  Platelet count elevated at 406.  Suspect reactive to above. -Continue to monitor  Polysubstance abuse Patient admits to smoking tobacco.  Review of records note prior toxicology screens positive for cocaine and marijuana.  She had been given Ativan due to concern for withdrawals -Check urine drug screen   DVT prophylaxis: SCDs. Advance Care Planning:   Code Status: Full Code    Consults: Orthopedics  Family Communication: none  Severity of Illness: The appropriate patient status for this patient is INPATIENT. Inpatient status is judged to be reasonable and necessary in order to provide the required intensity of service to ensure the patient's safety. The patient's presenting symptoms, physical exam findings, and initial radiographic and laboratory data in the context of their chronic comorbidities is felt to place them at high risk for further clinical deterioration. Furthermore, it is not anticipated that the patient will be medically stable for discharge from the hospital within 2 midnights of admission.   * I  certify that at the point of admission it is my clinical judgment that the patient will require inpatient hospital care spanning beyond 2 midnights from the point of admission due to high intensity of service, high risk for further deterioration and high frequency of surveillance required.*  Author: Clydie Braun, MD 07/20/2022 11:01 AM  For on call review [email or web address].

## 2022-07-20 NOTE — ED Notes (Signed)
ED TO INPATIENT HANDOFF REPORT  ED Nurse Name and Phone #: Angelica Chessman [phone]  S Name/Age/Gender [name] 37 y.o. female Room/Bed: H015C/H015C  Code Status   Code Status: Prior  Home/SNF/Other Home Patient oriented to: self, place, time, and situation Is this baseline? Yes   Triage Complete: Triage complete  Chief Complaint Finger infection [L08.9]  Triage Note PT BIB EMS for a spider bite to the left pinky, pinky is swollen and painful.  Patient is very fidgety, and can not stay still in the bed.    Allergies No Known Allergies  Level of Care/Admitting Diagnosis ED Disposition     ED Disposition  Admit   Condition  --   Comment  Hospital Area: [facility] [[id number]]  Level of Care: Telemetry Medical [104]  May admit patient to [facility] or [facility] if equivalent level of care is available:: No  Covid Evaluation: Asymptomatic - no recent exposure (last 10 days) testing not required  Diagnosis: Finger infection [829562]  Admitting Physician: Clydie Braun [[id number]]  Attending Physician: Clydie Braun [[id number]]  Certification:: I certify this patient will need inpatient services for at least 2 midnights  Estimated Length of Stay: 3          B Medical/Surgery History History reviewed. No pertinent past medical history. Past Surgical History:  Procedure Laterality Date   AMPUTATION Left 08/30/2016   Procedure: REVISION OF LEFT LONG FINGER AMPUTATION;  Surgeon: Mack Hook, MD;  Location: [facility] OR;  Service: Orthopedics;  Laterality: Left;   CESAREAN SECTION       A IV Location/Drains/Wounds Patient Lines/Drains/Airways Status     Active Line/Drains/Airways     Name Placement date Placement time Site Days   Peripheral IV 07/20/22 1" Left Antecubital 07/20/22  0842  Antecubital  less than 1            Intake/Output Last 24 hours No intake or output data in the 24 hours ending 07/20/22 1102  Labs/Imaging Results for  orders placed or performed during the hospital encounter of 07/20/22 (from the past 48 hour(s))  Basic metabolic panel     Status: Abnormal   Collection Time: 07/20/22  8:10 AM  Result Value Ref Range   Sodium 137 135 - 145 mmol/L   Potassium 4.5 3.5 - 5.1 mmol/L    Comment: HEMOLYSIS AT THIS LEVEL MAY AFFECT RESULT   Chloride 102 98 - 111 mmol/L   CO2 21 (L) 22 - 32 mmol/L   Glucose, Bld 78 70 - 99 mg/dL    Comment: Glucose reference range applies only to samples taken after fasting for at least 8 hours.   BUN 13 6 - 20 mg/dL   Creatinine, Ser 5.28 0.44 - 1.00 mg/dL   Calcium 9.4 8.9 - 41.3 mg/dL   GFR, Estimated >24 >40 mL/min    Comment: (NOTE) Calculated using the CKD-EPI Creatinine Equation (2021)    Anion gap 14 5 - 15    Comment: Performed at [facility], [address], [place], [place] [address]  Lactic acid, plasma     Status: None   Collection Time: 07/20/22  8:10 AM  Result Value Ref Range   Lactic Acid, Venous 0.8 0.5 - 1.9 mmol/L    Comment: Performed at [facility], [address] [place], [place] [address]  C-reactive protein     Status: Abnormal   Collection Time: 07/20/22  8:10 AM  Result Value Ref Range  CRP 6.9 (H) <1.0 mg/dL    Comment: Performed at [facility], [address], [place], [place] [address]  Ethanol     Status: None   Collection Time: 07/20/22  8:19 AM  Result Value Ref Range   Alcohol, Ethyl (B) <10 <10 mg/dL    Comment: (NOTE) Lowest detectable limit for serum alcohol is 10 mg/dL.  For medical purposes only. Performed at [facility], [address], [place], [place] [address]   Acetaminophen level     Status: Abnormal   Collection Time: 07/20/22  8:19 AM  Result Value Ref Range   Acetaminophen (Tylenol), Serum <10 (L) 10 - 30 ug/mL    Comment: (NOTE) Therapeutic concentrations vary significantly. A range of 10-30 ug/mL  may be an effective concentration for many patients. However, some  are best treated  at concentrations outside of this range. Acetaminophen concentrations >150 ug/mL at 4 hours after ingestion  and >50 ug/mL at 12 hours after ingestion are often associated with  toxic reactions.  Performed at [facility], [address], [place], [place] [address]   Lactic acid, plasma     Status: None   Collection Time: 07/20/22 10:00 AM  Result Value Ref Range   Lactic Acid, Venous 0.7 0.5 - 1.9 mmol/L    Comment: Performed at [facility], [address] [place], [place] [address]  CBC with Differential/Platelet     Status: Abnormal   Collection Time: 07/20/22 10:00 AM  Result Value Ref Range   WBC 13.2 (H) 4.0 - 10.5 K/uL   RBC 4.22 3.87 - 5.11 MIL/uL   Hemoglobin 12.7 12.0 - 15.0 g/dL   HCT 29.5 62.1 - 30.8 %   MCV 90.3 80.0 - 100.0 fL   MCH 30.1 26.0 - 34.0 pg   MCHC 33.3 30.0 - 36.0 g/dL   RDW 65.7 84.6 - 96.2 %   Platelets 406 (H) 150 - 400 K/uL   nRBC 0.0 0.0 - 0.2 %   Neutrophils Relative % 68 %   Neutro Abs 8.8 (H) 1.7 - 7.7 K/uL   Lymphocytes Relative 16 %   Lymphs Abs 2.2 0.7 - 4.0 K/uL   Monocytes Relative 15 %   Monocytes Absolute 2.0 (H) 0.1 - 1.0 K/uL   Eosinophils Relative 1 %   Eosinophils Absolute 0.2 0.0 - 0.5 K/uL   Basophils Relative 0 %   Basophils Absolute 0.0 0.0 - 0.1 K/uL   Immature Granulocytes 0 %   Abs Immature Granulocytes 0.05 0.00 - 0.07 K/uL    Comment: Performed at [facility], [address] [place], [place] [address]   DG Finger Little Left  Result Date: 07/20/2022 CLINICAL DATA:  Infection.  Left small finger spider bite. EXAM: LEFT FINGER(S) - 2+ VIEW COMPARISON:  Left hand x-rays dated August 30, 2016. FINDINGS: Severe soft tissue swelling of the small finger. No bony destruction or periosteal reaction. No acute fracture or dislocation. Joint spaces are preserved. Bone mineralization is normal. IMPRESSION: 1. Severe soft tissue swelling of the small finger. No acute osseous abnormality. Electronically Signed   By:  Obie Dredge M.D.   On: 07/20/2022 09:10    Pending Labs Unresulted Labs (From admission, onward)     Start     Ordered   07/20/22 0930  Sedimentation rate  Once,   STAT        07/20/22 0930   07/20/22 0811  Rapid urine drug screen (hospital  performed)  ONCE - STAT,   STAT        07/20/22 0811   07/20/22 0810  CBC with Differential  Once,   STAT        07/20/22 0811   07/20/22 0810  Blood culture (routine x 2)  BLOOD CULTURE X 2,   R (with STAT occurrences)      07/20/22 0811            Vitals/Pain Today's Vitals   07/20/22 0814 07/20/22 0845 07/20/22 0900  BP: (!) 147/132    Pulse: (!) 104    Resp: (!) 22    Temp: 97.7 F (36.5 C)    TempSrc: Oral    SpO2: 100%    Weight:   56.7 kg  Height:   4\' 11"  (1.499 m)  PainSc:  10-Worst pain ever     Isolation Precautions No active isolations  Medications Medications  vancomycin (VANCOCIN) IVPB 1000 mg/200 mL premix (1,000 mg Intravenous New Bag/Given 07/20/22 1020)  meloxicam (MOBIC) tablet 15 mg (has no administration in time range)  piperacillin-tazobactam (ZOSYN) IVPB 3.375 g (has no administration in time range)  vancomycin (VANCOCIN) IVPB 1000 mg/200 mL premix (has no administration in time range)  sodium chloride 0.9 % bolus 1,000 mL (has no administration in time range)  morphine (PF) 4 MG/ML injection 4 mg (4 mg Intravenous Given 07/20/22 0901)  piperacillin-tazobactam (ZOSYN) IVPB 3.375 g (0 g Intravenous Stopped 07/20/22 0932)  LORazepam (ATIVAN) injection 1 mg (1 mg Intravenous Given 07/20/22 1016)    Mobility walks     Focused Assessments Ortho, left infected joint/pinky   R Recommendations: See Admitting Provider Note  Report given to:   Additional Notes: Pt declines using drugs to me, but told the PA she had smoked crack last night, left pinky is very swollen

## 2022-07-20 NOTE — Progress Notes (Signed)
Pharmacy Antibiotic Note  [name] is a 37 y.o. female admitted on 07/20/2022 with  flexor tenosynovitis .  Pharmacy has been consulted for vancomycin/zosyn dosing. SCr 0.83 on presentation.  Plan: Zosyn 3.375g IV ( infusion) x1; then 3.375g IV q8h (4h infusion) Vancomycin 1g IV x1; then 1g IV q24h. Goal AUC 400-550. Expected AUC: 427 SCr used: 0.83 Monitor clinical progress, c/s, renal function F/u de-escalation plan/LOT, vancomycin levels as indicated      Temp (24hrs), Avg:97.7 F (36.5 C), Min:97.7 F (36.5 C), Max:97.7 F (36.5 C)  No results for input(s): "WBC", "CREATININE", "LATICACIDVEN", "VANCOTROUGH", "VANCOPEAK", "VANCORANDOM", "GENTTROUGH", "GENTPEAK", "GENTRANDOM", "TOBRATROUGH", "TOBRAPEAK", "TOBRARND", "AMIKACINPEAK", "AMIKACINTROU", "AMIKACIN" in the last 168 hours.  CrCl cannot be calculated (Patient's most recent lab result is older than the maximum 21 days allowed.).    No Known Allergies  Antimicrobials this admission: 5/25 vancomycin >>  5/25 zosyn >>   Dose adjustments this admission:   Microbiology results:   Leia Alf, PharmD, BCPS Please check AMION for all [facility] contact numbers Clinical Pharmacist 07/20/2022 8:24 AM

## 2022-07-20 NOTE — ED Triage Notes (Signed)
PT BIB EMS for a spider bite to the left pinky, pinky is swollen and painful.  Patient is very fidgety, and can not stay still in the bed.

## 2022-07-21 ENCOUNTER — Encounter (HOSPITAL_COMMUNITY): Payer: Self-pay | Admitting: Internal Medicine

## 2022-07-21 ENCOUNTER — Other Ambulatory Visit: Payer: Self-pay

## 2022-07-21 ENCOUNTER — Encounter (HOSPITAL_COMMUNITY)
Admission: EM | Disposition: A | Discharge status: Home / Self Care | Payer: Self-pay | Source: Home / Self Care | Attending: Internal Medicine

## 2022-07-21 ENCOUNTER — Inpatient Hospital Stay (HOSPITAL_COMMUNITY): Payer: Self-pay | Admitting: Certified Registered Nurse Anesthetist

## 2022-07-21 DIAGNOSIS — L089 Local infection of the skin and subcutaneous tissue, unspecified: Secondary | ICD-10-CM

## 2022-07-21 DIAGNOSIS — F17210 Nicotine dependence, cigarettes, uncomplicated: Secondary | ICD-10-CM

## 2022-07-21 HISTORY — PX: I & D EXTREMITY: SHX5045

## 2022-07-21 LAB — COMPREHENSIVE METABOLIC PANEL
ALT: 119 U/L — ABNORMAL HIGH (ref 0–44)
AST: 79 U/L — ABNORMAL HIGH (ref 15–41)
Albumin: 2.9 g/dL — ABNORMAL LOW (ref 3.5–5.0)
Alkaline Phosphatase: 122 U/L (ref 38–126)
Anion gap: 9 (ref 5–15)
BUN: 15 mg/dL (ref 6–20)
CO2: 24 mmol/L (ref 22–32)
Calcium: 8.6 mg/dL — ABNORMAL LOW (ref 8.9–10.3)
Chloride: 99 mmol/L (ref 98–111)
Creatinine, Ser: 1.08 mg/dL — ABNORMAL HIGH (ref 0.44–1.00)
GFR, Estimated: >60 mL/min (ref >60)
Glucose, Bld: 128 mg/dL — ABNORMAL HIGH (ref 70–99)
Potassium: 4 mmol/L (ref 3.5–5.1)
Sodium: 132 mmol/L — ABNORMAL LOW (ref 135–145)
Total Bilirubin: 0.5 mg/dL (ref 0.3–1.2)
Total Protein: 6.2 g/dL — ABNORMAL LOW (ref 6.5–8.1)

## 2022-07-21 LAB — CBC
HCT: 36.9 % (ref 36.0–46.0)
Hemoglobin: 12.2 g/dL (ref 12.0–15.0)
MCH: 29.9 pg (ref 26.0–34.0)
MCHC: 33.1 g/dL (ref 30.0–36.0)
MCV: 90.4 fL (ref 80.0–100.0)
Platelets: 375 10*3/uL (ref 150–400)
RBC: 4.08 MIL/uL (ref 3.87–5.11)
RDW: 13.7 % (ref 11.5–15.5)
WBC: 14.5 10*3/uL — ABNORMAL HIGH (ref 4.0–10.5)
nRBC: 0 % (ref 0.0–0.2)

## 2022-07-21 LAB — HCG, SERUM, QUALITATIVE: Preg, Serum: NEGATIVE

## 2022-07-21 SURGERY — IRRIGATION AND DEBRIDEMENT EXTREMITY
Anesthesia: General | Site: Little Finger | Laterality: Left

## 2022-07-21 MED ORDER — AMISULPRIDE (ANTIEMETIC) 5 MG/2ML IV SOLN: 10.0000 mg | Freq: Once | INTRAVENOUS | Status: DC | PRN

## 2022-07-21 MED ORDER — OXYCODONE HCL 5 MG/5ML PO SOLN: 5.0000 mg | Freq: Once | ORAL | Status: DC | PRN

## 2022-07-21 MED ORDER — ACETAMINOPHEN 325 MG PO TABS
650.0000 mg | ORAL_TABLET | Freq: Four times a day (QID) | ORAL | Status: DC
  Administered 2022-07-21 – 2022-07-27 (×24): 650 mg via ORAL
  Filled 2022-07-21 (×22): qty 2

## 2022-07-21 MED ORDER — DOCUSATE SODIUM 100 MG PO CAPS
100.0000 mg | ORAL_CAPSULE | Freq: Once | ORAL | Status: AC
  Administered 2022-07-21: 100 mg via ORAL
  Filled 2022-07-21: qty 1

## 2022-07-21 MED ORDER — CHLORHEXIDINE GLUCONATE 0.12 % MT SOLN: 15.0000 mL | Freq: Once | OROMUCOSAL | Status: AC

## 2022-07-21 MED ORDER — MIDAZOLAM HCL 2 MG/2ML IJ SOLN
INTRAMUSCULAR | Status: DC | PRN
  Administered 2022-07-21: 2 mg via INTRAVENOUS

## 2022-07-21 MED ORDER — LIDOCAINE HCL (PF) 1 % IJ SOLN
INTRAMUSCULAR | Status: DC | PRN
  Administered 2022-07-21: 10 mL

## 2022-07-21 MED ORDER — PROPOFOL 10 MG/ML IV BOLUS
INTRAVENOUS | Status: AC
  Filled 2022-07-21: qty 20

## 2022-07-21 MED ORDER — FENTANYL CITRATE (PF) 250 MCG/5ML IJ SOLN
INTRAMUSCULAR | Status: AC
  Filled 2022-07-21: qty 5

## 2022-07-21 MED ORDER — OXYCODONE HCL 5 MG PO TABS: 5.0000 mg | ORAL_TABLET | Freq: Once | ORAL | Status: DC | PRN

## 2022-07-21 MED ORDER — CHLORHEXIDINE GLUCONATE 0.12 % MT SOLN
OROMUCOSAL | Status: AC
  Administered 2022-07-21: 15 mL via OROMUCOSAL
  Filled 2022-07-21: qty 15

## 2022-07-21 MED ORDER — FENTANYL CITRATE (PF) 250 MCG/5ML IJ SOLN
INTRAMUSCULAR | Status: DC | PRN
  Administered 2022-07-21: 50 ug via INTRAVENOUS
  Administered 2022-07-21: 25 ug via INTRAVENOUS
  Administered 2022-07-21: 50 ug via INTRAVENOUS

## 2022-07-21 MED ORDER — LIDOCAINE HCL (PF) 1 % IJ SOLN
INTRAMUSCULAR | Status: AC
  Filled 2022-07-21: qty 30

## 2022-07-21 MED ORDER — PROMETHAZINE HCL 25 MG/ML IJ SOLN: 6.2500 mg | INTRAMUSCULAR | Status: DC | PRN

## 2022-07-21 MED ORDER — MIDAZOLAM HCL 2 MG/2ML IJ SOLN
INTRAMUSCULAR | Status: AC
  Filled 2022-07-21: qty 2

## 2022-07-21 MED ORDER — ONDANSETRON HCL 4 MG/2ML IJ SOLN
INTRAMUSCULAR | Status: DC | PRN
  Administered 2022-07-21: 4 mg via INTRAVENOUS

## 2022-07-21 MED ORDER — ORAL CARE MOUTH RINSE: 15.0000 mL | Freq: Once | OROMUCOSAL | Status: AC

## 2022-07-21 MED ORDER — 0.9 % SODIUM CHLORIDE (POUR BTL) OPTIME
TOPICAL | Status: DC | PRN
  Administered 2022-07-21: 1000 mL

## 2022-07-21 MED ORDER — KETOROLAC TROMETHAMINE 30 MG/ML IJ SOLN: 30.0000 mg | Freq: Once | INTRAMUSCULAR | Status: DC | PRN

## 2022-07-21 MED ORDER — LACTATED RINGERS IV SOLN: INTRAVENOUS | Status: DC

## 2022-07-21 MED ORDER — ACETAMINOPHEN 10 MG/ML IV SOLN: 1000.0000 mg | Freq: Once | INTRAVENOUS | Status: DC | PRN

## 2022-07-21 MED ORDER — FENTANYL CITRATE (PF) 100 MCG/2ML IJ SOLN: 25.0000 ug | INTRAMUSCULAR | Status: DC | PRN

## 2022-07-21 MED ORDER — LIDOCAINE 2% (20 MG/ML) 5 ML SYRINGE
INTRAMUSCULAR | Status: DC | PRN
  Administered 2022-07-21: 60 mg via INTRAVENOUS

## 2022-07-21 MED ORDER — PROPOFOL 10 MG/ML IV BOLUS
INTRAVENOUS | Status: DC | PRN
  Administered 2022-07-21 (×2): 20 mg via INTRAVENOUS
  Administered 2022-07-21: 200 mg via INTRAVENOUS

## 2022-07-21 SURGICAL SUPPLY — 51 items
APL PRP STRL LF DISP 70% ISPRP (MISCELLANEOUS) ×1
BAG COUNTER SPONGE SURGICOUNT (BAG) ×1 IMPLANT
BAG SPNG CNTER NS LX DISP (BAG)
BAND INSRT 18 STRL LF DISP RB (MISCELLANEOUS)
BAND RUBBER #18 3X1/16 STRL (MISCELLANEOUS) IMPLANT
BLADE SURG 15 STRL LF DISP TIS (BLADE) ×1 IMPLANT
BLADE SURG 15 STRL SS (BLADE) ×1
BNDG CMPR 5X2 CHSV 1 LYR STRL (GAUZE/BANDAGES/DRESSINGS)
BNDG CMPR MD 5X2 ELC HKLP STRL (GAUZE/BANDAGES/DRESSINGS) ×1
BNDG COHESIVE 2X5 TAN ST LF (GAUZE/BANDAGES/DRESSINGS) IMPLANT
BNDG COHESIVE 4X5 TAN STRL (GAUZE/BANDAGES/DRESSINGS) ×1 IMPLANT
BNDG ELASTIC 2 VLCR STRL LF (GAUZE/BANDAGES/DRESSINGS) IMPLANT
BNDG GAUZE DERMACEA FLUFF 4 (GAUZE/BANDAGES/DRESSINGS) ×2 IMPLANT
BNDG GZE 12X3 1 PLY HI ABS (GAUZE/BANDAGES/DRESSINGS) ×1
BNDG GZE DERMACEA 4 6PLY (GAUZE/BANDAGES/DRESSINGS)
BNDG STRETCH GAUZE 3IN X12FT (GAUZE/BANDAGES/DRESSINGS) IMPLANT
CHLORAPREP W/TINT 26 (MISCELLANEOUS) ×1 IMPLANT
CORD BIPOLAR FORCEPS 12FT (ELECTRODE) ×1 IMPLANT
CUFF TOURN SGL QUICK 18X4 (TOURNIQUET CUFF) IMPLANT
DRAPE SURG 17X23 STRL (DRAPES) ×1 IMPLANT
DRSG EMULSION OIL 3X3 NADH (GAUZE/BANDAGES/DRESSINGS) ×1 IMPLANT
DRSG XEROFORM 1X8 (GAUZE/BANDAGES/DRESSINGS) IMPLANT
GAUZE PACKING IODOFORM 1/4X15 (PACKING) IMPLANT
GAUZE SPONGE 4X4 12PLY STRL (GAUZE/BANDAGES/DRESSINGS) ×1 IMPLANT
GLOVE BIO SURGEON STRL SZ7.5 (GLOVE) ×1 IMPLANT
GLOVE BIOGEL PI IND STRL 7.0 (GLOVE) ×1 IMPLANT
GLOVE BIOGEL PI IND STRL 8 (GLOVE) ×1 IMPLANT
GLOVE ECLIPSE 6.5 STRL STRAW (GLOVE) ×1 IMPLANT
GOWN STRL REUS W/ TWL LRG LVL3 (GOWN DISPOSABLE) ×2 IMPLANT
GOWN STRL REUS W/ TWL XL LVL3 (GOWN DISPOSABLE) ×1 IMPLANT
GOWN STRL REUS W/TWL LRG LVL3 (GOWN DISPOSABLE) ×2
GOWN STRL REUS W/TWL XL LVL3 (GOWN DISPOSABLE) ×1
KIT BASIN OR (CUSTOM PROCEDURE TRAY) ×1 IMPLANT
NDL HYPO 18GX1.5 BLUNT FILL (NEEDLE) IMPLANT
NDL HYPO 22X1.5 SAFETY MO (MISCELLANEOUS) IMPLANT
NDL HYPO 25X1 1.5 SAFETY (NEEDLE) IMPLANT
NEEDLE HYPO 18GX1.5 BLUNT FILL (NEEDLE) ×1 IMPLANT
NEEDLE HYPO 22X1.5 SAFETY MO (MISCELLANEOUS) ×1 IMPLANT
NEEDLE HYPO 25X1 1.5 SAFETY (NEEDLE) IMPLANT
NS IRRIG 1000ML POUR BTL (IV SOLUTION) ×1 IMPLANT
PACK ORTHO EXTREMITY (CUSTOM PROCEDURE TRAY) ×1 IMPLANT
PADDING CAST ABS COTTON 4X4 ST (CAST SUPPLIES) IMPLANT
SET IRRIG Y TYPE TUR BLADDER L (SET/KITS/TRAYS/PACK) IMPLANT
STOCKINETTE 6  STRL (DRAPES)
STOCKINETTE 6 STRL (DRAPES) ×1 IMPLANT
SUT VICRYL RAPIDE 4/0 PS 2 (SUTURE) IMPLANT
SYR 10ML LL (SYRINGE) IMPLANT
SYR 30ML LL (SYRINGE) IMPLANT
SYR CONTROL 10ML LL (SYRINGE) IMPLANT
TUBE CONNECTING 12X1/4 (SUCTIONS) IMPLANT
UNDERPAD 30X36 HEAVY ABSORB (UNDERPADS AND DIAPERS) ×1 IMPLANT

## 2022-07-21 NOTE — Progress Notes (Signed)
Pacu RN Report to floor given  Gave report to  [employer]. Room: 5N31    Discussed surgery, meds given in OR and Pacu, VS, IV fluids given, EBL, urine output, pain and other pertinent information. Also discussed if pt had any family or friends here or belongings with them.   Pt was very somulent in Pre op, she had not gotten very much on the floor for pain, continue to watch.   L arm/hand is to be elevated, also have ice bag to area. Bulky ace wrap dressing in place with Iodoform packing, Xeroform, 4x4 gauze, ace wrap. VSS.   Pt did not want anyone called.   Pt exits my care.

## 2022-07-21 NOTE — Anesthesia Postprocedure Evaluation (Signed)
Anesthesia Post Note  Patient: Dione Plover  Procedure(s) Performed: Left small finger excision of skin and subcutaneous tissue, with prox-distal irrigation (Left: Little Finger)     Patient location during evaluation: PACU Anesthesia Type: General Level of consciousness: awake Pain management: pain level controlled Vital Signs Assessment: post-procedure vital signs reviewed and stable Respiratory status: spontaneous breathing, nonlabored ventilation and respiratory function stable Cardiovascular status: blood pressure returned to baseline and stable Postop Assessment: no apparent nausea or vomiting Anesthetic complications: no   No notable events documented.  Last Vitals:  Vitals:   07/21/22 1125 07/21/22 1219  BP:  (!) 136/92  Pulse: 91 91  Resp: 14 17  Temp:  37 C  SpO2: 95% 98%    Last Pain:  Vitals:   07/21/22 1640  TempSrc:   PainSc: 5                  Ahmari Duerson P Saim Almanza

## 2022-07-21 NOTE — Transfer of Care (Signed)
Immediate Anesthesia Transfer of Care Note  Patient: [name]  Procedure(s) Performed: IRRIGATION AND DEBRIDEMENT OF SMALL FINGER (Left)  Patient Location: PACU  Anesthesia Type:General  Level of Consciousness: drowsy  Airway & Oxygen Therapy: Patient Spontanous Breathing and Patient connected to face mask oxygen  Post-op Assessment: Report given to RN and Post -op Vital signs reviewed and stable  Post vital signs: Reviewed and stable  Last Vitals:  Vitals Value Taken Time  BP 112/83 07/21/22 1049  Temp    Pulse 91 07/21/22 1054  Resp 12 07/21/22 1054  SpO2 100 % 07/21/22 1054  Vitals shown include unvalidated device data.  Last Pain:  Vitals:   07/21/22 0753  TempSrc: Oral  PainSc:       Patients Stated Pain Goal: 0 (07/20/22 2117)  Complications: No notable events documented.

## 2022-07-21 NOTE — Anesthesia Preprocedure Evaluation (Addendum)
Anesthesia Evaluation  Patient identified by MRN, date of birth, ID band Patient awake    Reviewed: Allergy & Precautions, NPO status , Patient's Chart, lab work & pertinent test results  Airway Mallampati: III  TM Distance: >3 FB Neck ROM: Full    Dental no notable dental hx.    Pulmonary Current Smoker and Patient abstained from smoking.   Pulmonary exam normal        Cardiovascular negative cardio ROS Normal cardiovascular exam     Neuro/Psych negative neurological ROS  negative psych ROS   GI/Hepatic negative GI ROS,,,(+)     substance abuse  cocaine use and marijuana use  Endo/Other  negative endocrine ROS    Renal/GU negative Renal ROS     Musculoskeletal negative musculoskeletal ROS (+)  narcotic dependent  Abdominal   Peds  Hematology negative hematology ROS (+)   Anesthesia Other Findings flexor tenosynovitis of finger  Reproductive/Obstetrics                             Anesthesia Physical Anesthesia Plan  ASA: 2  Anesthesia Plan: General   Post-op Pain Management:    Induction: Intravenous  PONV Risk Score and Plan: 2 and Ondansetron, Dexamethasone and Treatment may vary due to age or medical condition  Airway Management Planned: LMA  Additional Equipment:   Intra-op Plan:   Post-operative Plan: Extubation in OR  Informed Consent: I have reviewed the patients History and Physical, chart, labs and discussed the procedure including the risks, benefits and alternatives for the proposed anesthesia with the patient or authorized representative who has indicated his/her understanding and acceptance.     Dental advisory given  Plan Discussed with: CRNA  Anesthesia Plan Comments:         Anesthesia Quick Evaluation

## 2022-07-21 NOTE — Hospital Course (Signed)
[name] is a 37 y.o. female with a history of polysubstance abuse.  Patient presented secondary to pain and swelling of her left finger after insect bite and was found to have concern for acute infection.  Evidence of sepsis on admission as well.  Empiric antibiotics started and blood cultures obtained.  Orthopedic surgery was consulted and performed excision of skin/subcutaneous tissue with proximals-distal irrigation on 5/26.  Wound cultures pending.

## 2022-07-21 NOTE — Progress Notes (Signed)
PROGRESS NOTE    [name]  UJW:[id number] DOB: 1985-07-15 DOA: 07/20/2022 PCP: [employer], User, MD (Inactive)   Brief Narrative: [name] is a 37 y.o. female with a history of polysubstance abuse.  Patient presented secondary to pain and swelling of her left finger after insect bite and was found to have concern for acute infection.  Evidence of sepsis on admission as well.  Empiric antibiotics started and blood cultures obtained.  Orthopedic surgery was consulted and performed excision of skin/subcutaneous tissue with proximals-distal irrigation on 5/26.  Wound cultures pending.   Assessment and Plan:  Finger infection Left fifth digit.  Blood cultures obtained.  Empiric vancomycin and Zosyn started on admission.  Orthopedic surgery consulted on admission and performed an excision of skin/subcutaneous tissue with proximal-distal irrigation on 5/26.  Recommendations from orthopedic surgery for continued IV antibiotics and to follow-up wound cultures. -Continue vancomycin and Zosyn -Follow-up blood cultures (5/24) and wound culture (5/26)  Sepsis Present on admission.  Source is her finger infection.  Patient started empirically on antibiotics on admission with blood cultures obtained.  Blood cultures so far no growth to date.  Leukocytosis is stable.  Elevated AST/ALT Unclear etiology. Trending down.  Thrombocytosis Mildly elevated platelets of 406,000.  Likely reactive and secondary to acute infection.  Trended down.  Polysubstance abuse Patient has a history of tobacco cocaine, marijuana use.  Current toxicology screen was significant for positive cocaine and THC.  Toxicology screen also positive for opiates, however patient received morphine prior to toxicology screen.   DVT prophylaxis: SCDs Code Status:   Code Status: Full Code Family Communication: None at bedside Disposition Plan: Discharge likely in 2 days pending culture data and transition to  outpatient antibiotics   Consultants:  Orthopedic surgery  Procedures:  5/26: Left small finger excision of skin and subcutaneous tissue, with prox-distal irrigation   Antimicrobials: Vancomycin IV Zosyn IV   Subjective: Patient seen in the PACU.  Patient still slightly somnolent.  Objective: BP (!) 134/97 (BP Location: Right Arm)   Pulse 92   Temp 99 F (37.2 C) (Oral)   Resp 18   Ht 4\' 11"  (1.499 m)   Wt 56.7 kg   SpO2 97%   BMI 25.25 kg/m   Examination:  General exam: Appears calm and comfortable Respiratory system: Clear to auscultation. Respiratory effort normal. Cardiovascular system: S1 & S2 heard, RRR. No murmurs. Gastrointestinal system: Abdomen is nondistended, soft and nontender. Normal bowel sounds heard. Central nervous system: Somnolent but arouses to voice. Musculoskeletal: No edema. No calf tenderness.  Left hand and surgical dressing.   Data Reviewed: I have personally reviewed following labs and imaging studies  CBC Lab Results  Component Value Date   WBC 14.5 (H) 07/21/2022   RBC 4.08 07/21/2022   HGB 12.2 07/21/2022   HCT 36.9 07/21/2022   MCV 90.4 07/21/2022   MCH 29.9 07/21/2022   PLT 375 07/21/2022   MCHC 33.1 07/21/2022   RDW 13.7 07/21/2022   LYMPHSABS 2.2 07/20/2022   MONOABS 2.0 (H) 07/20/2022   EOSABS 0.2 07/20/2022   BASOSABS 0.0 07/20/2022     Last metabolic panel Lab Results  Component Value Date   NA 132 (L) 07/21/2022   K 4.0 07/21/2022   CL 99 07/21/2022   CO2 24 07/21/2022   BUN 15 07/21/2022   CREATININE 1.08 (H) 07/21/2022   GLUCOSE 128 (H) 07/21/2022   GFRNONAA >60 07/21/2022   GFRAA >60 10/14/2019   CALCIUM 8.6 (L) 07/21/2022  PROT 6.2 (L) 07/21/2022   ALBUMIN 2.9 (L) 07/21/2022   BILITOT 0.5 07/21/2022   ALKPHOS 122 07/21/2022   AST 79 (H) 07/21/2022   ALT 119 (H) 07/21/2022   ANIONGAP 9 07/21/2022    GFR: Estimated Creatinine Clearance: 55.3 mL/min (A) (by C-G formula based on SCr of 1.08  mg/dL (H)).  No results found for this or any previous visit (from the past 240 hour(s)).    Radiology Studies: DG Finger Little Left  Result Date: 07/20/2022 CLINICAL DATA:  Infection.  Left small finger spider bite. EXAM: LEFT FINGER(S) - 2+ VIEW COMPARISON:  Left hand x-rays dated August 30, 2016. FINDINGS: Severe soft tissue swelling of the small finger. No bony destruction or periosteal reaction. No acute fracture or dislocation. Joint spaces are preserved. Bone mineralization is normal. IMPRESSION: 1. Severe soft tissue swelling of the small finger. No acute osseous abnormality. Electronically Signed   By: Obie Dredge M.D.   On: 07/20/2022 09:10      LOS: 1 day    Jacquelin Hawking, MD [facility] 07/21/2022, 7:54 AM   If 7PM-7AM, please contact night-coverage [email or web address]

## 2022-07-21 NOTE — Interval H&P Note (Signed)
History and Physical Interval Note:  07/21/2022 8:03 AM  [name]  has presented today for surgery, with the diagnosis of left small finger deep infection.  Overnight, the finger broad desquamation has progressed a little, and the swelling and pain remain.  It seems contained to the finger, as the palm is relatively normal.  The various methods of treatment have been discussed with the patient. After consideration of risks, benefits and other options for treatment, the patient has consented to left small finger exploration and drainage as a surgical intervention.  The patient's history has been reviewed, patient examined, no change in status, stable for surgery.  I have reviewed the patient's chart and labs.  Questions were answered to the patient's satisfaction.     Jodi Marble

## 2022-07-21 NOTE — Op Note (Signed)
07/20/2022 - 07/21/2022  8:34 AM  PATIENT:  [name]  37 y.o. female  PRE-OPERATIVE DIAGNOSIS:  left small finger presumed deep infection  POST-OPERATIVE DIAGNOSIS:  Same  PROCEDURE:  Left small finger excision of skin and subcutaneous tissue, with prox-distal irrigation  SURGEON: Cliffton Asters. Janee Morn, MD  PHYSICIAN ASSISTANT: none  ANESTHESIA:  general  SPECIMENS:  swabs to micro  DRAINS:   iodoform packing  EBL:  <51mL  PREOPERATIVE INDICATIONS:  [name] is a  37 y.o. female with a several day h/o left small finger presumed deep infection, failing to improve significantly on parenteral antibiotics alone.  The risks benefits and alternatives were discussed with the patient preoperatively including but not limited to the risks of infection, bleeding, nerve injury, cardiopulmonary complications, the need for revision surgery, among others, and the patient verbalized understanding and consented to proceed.  OPERATIVE IMPLANTS: none  OPERATIVE PROCEDURE:  After receiving prophylactic antibiotics, the patient was escorted to the operative theatre and placed in a supine position.  General anesthesia was administered.  A surgical "time-out" was performed during which the planned procedure, proposed operative site, and the correct patient identity were compared to the operative consent and agreement confirmed by the circulating nurse according to current facility policy.  Following application of a tourniquet to the operative extremity, the exposed skin was prepped with Chloraprep and draped in the usual sterile fashion.  The limb was exsanguinated with an Esmarch bandage beginning on the hand and going proximal,and the tourniquet inflated to approximately higher than systolic BP.  The volar surface of the thumb had desquamation throughout much of the surface.  This was punctured with tenotomy scissors and creamy pus bring forth.  Swabs were obtained.  The  desquamated epidermis was excised essentially throughout the entire volar surface of the finger, to include removal of the nail plate.  There was a perforation in the dermis at the tip of the digit only, and once all of the epidermis had been excised back to stable margins, which is really the skin on the dorsum, much of the pus and pressure on the digit and then removed.  However there was still the ability to express some purulence from the opening at the tip of the digit.  This was opened further with spreading dissection and margins excised to include skin and subcutaneous tissue.  The infectious process had entirely envelope the bulk of the distal portion of the distal phalanx such that the nailbed was not adherent to it, nor was the palmar tissue.  The wound in this region was copiously irrigated to include between the nailbed and the distal phalanx as well as between the pulp and the phalanx.  An accessory incision was made over the A1 pulley spreading dissection carried down into the subcutaneous plane.  Irrigation was introduced with a blunt needle at this level directed distally and it flowed within the volar subcutaneous tissues freely out the tip of the digit.  This was done repeatedly with a 20 mL syringe about 6 times.  The irrigant was clear the last couple of irrigations.  The finger was markedly decompressed and much smaller.  The dermis was beat up but entirely intact except for the tip of the digit.  Using a Carroll tendon passer, iodoform packing was placed into each wound, with tails extruding from each wound.  The tourniquet was released and the digit dressed lightly with Xeroform, gauze, Kling and Ace wrap.  She was taken to the  recovery room in stable condition, breathing spontaneously.  DISPOSITION: She will be discharged back to the floor for continued parenteral antibiotic therapy, continuing broad-spectrum until able to be guided by culture results.  Packings will be pulled tomorrow  morning.  She will likely need to remain hospitalized for at least the next 2 days to ensure a trajectory of improvement.  Upon discharge she will need outpatient OT to try to prevent the tremendous stiffness and lack of movement that is sometimes associated with infections such as this.

## 2022-07-21 NOTE — Anesthesia Procedure Notes (Signed)
Procedure Name: LMA Insertion Date/Time: 07/21/2022 10:29 AM  Performed by: Dorie Rank, CRNAPre-anesthesia Checklist: Patient identified, Emergency Drugs available, Suction available, Patient being monitored and Timeout performed Patient Re-evaluated:Patient Re-evaluated prior to induction Oxygen Delivery Method: Circle system utilized Preoxygenation: Pre-oxygenation with 100% oxygen Induction Type: IV induction LMA: LMA inserted LMA Size: 4.0 Number of attempts: 1 Placement Confirmation: breath sounds checked- equal and bilateral and positive ETCO2 Tube secured with: Tape Dental Injury: Teeth and Oropharynx as per pre-operative assessment

## 2022-07-22 ENCOUNTER — Encounter (HOSPITAL_COMMUNITY): Payer: Self-pay | Admitting: Orthopedic Surgery

## 2022-07-22 LAB — CBC
HCT: 36.8 % (ref 36.0–46.0)
Hemoglobin: 12.2 g/dL (ref 12.0–15.0)
MCH: 30.2 pg (ref 26.0–34.0)
MCHC: 33.2 g/dL (ref 30.0–36.0)
MCV: 91.1 fL (ref 80.0–100.0)
Platelets: 394 10*3/uL (ref 150–400)
RBC: 4.04 MIL/uL (ref 3.87–5.11)
RDW: 13.8 % (ref 11.5–15.5)
WBC: 12.1 10*3/uL — ABNORMAL HIGH (ref 4.0–10.5)
nRBC: 0 % (ref 0.0–0.2)

## 2022-07-22 LAB — BASIC METABOLIC PANEL
Anion gap: 9 (ref 5–15)
BUN: 13 mg/dL (ref 6–20)
CO2: 25 mmol/L (ref 22–32)
Calcium: 8.6 mg/dL — ABNORMAL LOW (ref 8.9–10.3)
Chloride: 101 mmol/L (ref 98–111)
Creatinine, Ser: 0.79 mg/dL (ref 0.44–1.00)
GFR, Estimated: >60 mL/min (ref >60)
Glucose, Bld: 97 mg/dL (ref 70–99)
Potassium: 3.7 mmol/L (ref 3.5–5.1)
Sodium: 135 mmol/L (ref 135–145)

## 2022-07-22 LAB — AEROBIC/ANAEROBIC CULTURE W GRAM STAIN (SURGICAL/DEEP WOUND)

## 2022-07-22 MED ORDER — MORPHINE SULFATE (PF) 2 MG/ML IV SOLN
2.0000 mg | Freq: Once | INTRAVENOUS | Status: AC
  Administered 2022-07-22: 2 mg via INTRAVENOUS
  Filled 2022-07-22: qty 1

## 2022-07-22 NOTE — Plan of Care (Signed)
  Problem: Pain Managment: Goal: General experience of comfort will improve Outcome: Not Progressing   Problem: Elimination: Goal: Will not experience complications related to bowel motility Outcome: Not Progressing   Problem: Clinical Measurements: Goal: Will remain free from infection Outcome: Not Progressing

## 2022-07-22 NOTE — Progress Notes (Signed)
  PATIENT ID: [name]  MRN: [id number]  DOB/AGE:  1985-12-20 / 37 y.o.  07-21-22 I&D L SF  Subjective: Pain is severe by her report, but is found soundly sleeping, requiring me to awaken her to examine her finger.  No c/o chest pain or SOB.  Dressing has been changed, but packings not successfully pulled    Objective: Vital signs in last 24 hours: Temp:  [97.8 F (36.6 C)-99.1 F (37.3 C)] 98.6 F (37 C) (05/27 0739) Pulse Rate:  [77-99] 81 (05/27 0739) Resp:  [13-18] 18 (05/27 0739) BP: (100-136)/(74-92) 100/74 (05/27 0739) SpO2:  [95 %-100 %] 99 % (05/27 0739)  Intake/Output from previous day: 05/26 0701 - 05/27 0700 In: 1071.3 [P.O.:237; I.V.:500; IV Piggyback:334.3] Out: 1 [Blood:1] Intake/Output this shift: No intake/output data recorded.  Recent Labs    07/20/22 1000 07/21/22 0247 07/22/22 0329  HGB 12.7 12.2 12.2   Recent Labs    07/21/22 0247 07/22/22 0329  WBC 14.5* 12.1*  RBC 4.08 4.04  HCT 36.9 36.8  PLT 375 394   Recent Labs    07/20/22 0810 07/21/22 0247  NA 137 132*  K 4.5 4.0  CL 102 99  CO2 21* 24  BUN 13 15  CREATININE 0.83 1.08*  GLUCOSE 78 128*  CALCIUM 9.4 8.6*   No results for input(s): "LABPT", "INR" in the last 72 hours.  Physical Exam: L SF still swollen, dermis reddened with a couple of dark spots.  Packings pulled and no purulence expressed.  Sitting nearly fully extended.  Reluctant to do AROM with digit  Surgical cx Gm stain = GPC in pairs; Cx Pending  Assessment/Plan: 1 Day Post-Op    I pulled packings.  Digit can be left open to air, and only dressed lightly at actual open wounds if drainage is getting on her clothes, etc.  Can place xeroform on raw areas to minimize dressing adhesion.  Dressings, if needed, can be soaked to help in changing as necessary.    Given this severe L SF infection with possibility of early osteomyelitis, recommend ID consult to determine best outpatient med, delivery method, duration,  etc since hospitalist service has no outpatient clinic to continue to evaluate patient for infection resolution, modification of antibiotics, decision to stop antibiotics, etc as those decisions are outside my field of expertise.    I will plan to have patient back to oversee therapy/rehab/functional recovery.  Cliffton Asters Janee Morn, MD      Orthopaedic & Hand Surgery [facility] Orthopaedic & Sports Medicine [facility] [address] [place], [place]  [address] Office: [phone]  07/22/2022, 10:25 AM

## 2022-07-22 NOTE — Progress Notes (Signed)
PROGRESS NOTE    [name]  ZOX:[id number] DOB: 01/10/1986 DOA: 07/20/2022 PCP: [employer], User, MD (Inactive)   Brief Narrative: [name] is a 37 y.o. female with a history of polysubstance abuse.  Patient presented secondary to pain and swelling of her left finger after insect bite and was found to have concern for acute infection.  Evidence of sepsis on admission as well.  Empiric antibiotics started and blood cultures obtained.  Orthopedic surgery was consulted and performed excision of skin/subcutaneous tissue with proximals-distal irrigation on 5/26.  Wound cultures pending.   Assessment and Plan:  Finger infection Left fifth digit.  Blood cultures obtained.  Empiric vancomycin and Zosyn started on admission.  Orthopedic surgery consulted on admission and performed an excision of skin/subcutaneous tissue with proximal-distal irrigation on 5/26.  Recommendations from orthopedic surgery for continued IV antibiotics and to follow-up wound cultures. Wound culture significant for staphylococcus aureus. -Continue vancomycin and Zosyn -Follow-up blood cultures (5/24) and wound culture (5/26)  Sepsis Present on admission.  Source is her finger infection and secondary to staphylococcus aureus as seen on wound culture.  Patient started empirically on antibiotics on admission with blood cultures obtained.  Blood cultures so far no growth to date.  Leukocytosis is trending down.  Elevated AST/ALT Unclear etiology. Possibly related to acute infection. Trending down.  Thrombocytosis Mildly elevated platelets of 406,000.  Likely reactive and secondary to acute infection.  Trended down.  Polysubstance abuse Patient has a history of tobacco cocaine, marijuana use.  Current toxicology screen was significant for positive cocaine and THC.  Toxicology screen also positive for opiates, however patient received morphine prior to toxicology screen.   DVT prophylaxis: SCDs Code  Status:   Code Status: Full Code Family Communication: None at bedside Disposition Plan: Discharge likely in 1-2 days pending culture data and transition to outpatient antibiotics   Consultants:  Orthopedic surgery  Procedures:  5/26: Left small finger excision of skin and subcutaneous tissue, with prox-distal irrigation   Antimicrobials: Vancomycin IV Zosyn IV   Subjective: Patient states she has pain with attempts to remove surgical packing. No other concerns.  Objective: BP 100/74 (BP Location: Right Arm)   Pulse 81   Temp 98.6 F (37 C) (Oral)   Resp 18   Ht 4' 11.02" (1.499 m)   Wt 56.7 kg   SpO2 99%   BMI 25.23 kg/m   Examination:  General exam: Appears calm and comfortable Respiratory system: Clear to auscultation. Respiratory effort normal. Cardiovascular system: S1 & S2 heard, RRR. No murmurs, rubs, gallops or clicks. Gastrointestinal system: Abdomen is nondistended, soft and nontender. Normal bowel sounds heard. Central nervous system: Alert and oriented. No focal neurological deficits. Musculoskeletal: Left hand in surgical dressing. Skin: No cyanosis. No rashes Psychiatry: Judgement and insight appear normal. Mood & affect appropriate.    Data Reviewed: I have personally reviewed following labs and imaging studies  CBC Lab Results  Component Value Date   WBC 12.1 (H) 07/22/2022   RBC 4.04 07/22/2022   HGB 12.2 07/22/2022   HCT 36.8 07/22/2022   MCV 91.1 07/22/2022   MCH 30.2 07/22/2022   PLT 394 07/22/2022   MCHC 33.2 07/22/2022   RDW 13.8 07/22/2022   LYMPHSABS 2.2 07/20/2022   MONOABS 2.0 (H) 07/20/2022   EOSABS 0.2 07/20/2022   BASOSABS 0.0 07/20/2022     Last metabolic panel Lab Results  Component Value Date   NA 132 (L) 07/21/2022   K 4.0 07/21/2022  CL 99 07/21/2022   CO2 24 07/21/2022   BUN 15 07/21/2022   CREATININE 1.08 (H) 07/21/2022   GLUCOSE 128 (H) 07/21/2022   GFRNONAA >60 07/21/2022   GFRAA >60 10/14/2019    CALCIUM 8.6 (L) 07/21/2022   PROT 6.2 (L) 07/21/2022   ALBUMIN 2.9 (L) 07/21/2022   BILITOT 0.5 07/21/2022   ALKPHOS 122 07/21/2022   AST 79 (H) 07/21/2022   ALT 119 (H) 07/21/2022   ANIONGAP 9 07/21/2022    GFR: Estimated Creatinine Clearance: 55.3 mL/min (A) (by C-G formula based on SCr of 1.08 mg/dL (H)).  Recent Results (from the past 240 hour(s))  Blood culture (routine x 2)     Status: None (Preliminary result)   Collection Time: 07/20/22  8:54 AM   Specimen: BLOOD  Result Value Ref Range Status   Specimen Description BLOOD SITE NOT SPECIFIED  Final   Special Requests   Final    BOTTLES DRAWN AEROBIC AND ANAEROBIC Blood Culture results may not be optimal due to an inadequate volume of blood received in culture bottles   Culture   Final    NO GROWTH < 24 HOURS Performed at [facility], [address], [place], [place] [address]    Report Status PENDING  Incomplete  Blood culture (routine x 2)     Status: None (Preliminary result)   Collection Time: 07/20/22  9:18 AM   Specimen: BLOOD  Result Value Ref Range Status   Specimen Description BLOOD SITE NOT SPECIFIED  Final   Special Requests   Final    BOTTLES DRAWN AEROBIC AND ANAEROBIC Blood Culture adequate volume   Culture   Final    NO GROWTH < 24 HOURS Performed at [facility], [address] [place], [place] [address]    Report Status PENDING  Incomplete  Aerobic/Anaerobic Culture w Gram Stain (surgical/deep wound)     Status: None (Preliminary result)   Collection Time: 07/21/22 10:13 AM   Specimen: Finger, Left; Abscess  Result Value Ref Range Status   Specimen Description ABSCESS  Final   Special Requests LEFT SMALL FINGER, ZOSYN, VANCOMYCIN  Final   Gram Stain   Final    RARE WBC PRESENT, PREDOMINANTLY MONONUCLEAR FEW GRAM POSITIVE COCCI IN PAIRS IN CLUSTERS Performed at [facility], [address], [place], [place] [address]    Culture PENDING  Incomplete   Report Status PENDING   Incomplete      Radiology Studies: DG Finger Little Left  Result Date: 07/20/2022 CLINICAL DATA:  Infection.  Left small finger spider bite. EXAM: LEFT FINGER(S) - 2+ VIEW COMPARISON:  Left hand x-rays dated August 30, 2016. FINDINGS: Severe soft tissue swelling of the small finger. No bony destruction or periosteal reaction. No acute fracture or dislocation. Joint spaces are preserved. Bone mineralization is normal. IMPRESSION: 1. Severe soft tissue swelling of the small finger. No acute osseous abnormality. Electronically Signed   By: Obie Dredge M.D.   On: 07/20/2022 09:10      LOS: 2 days    Jacquelin Hawking, MD [facility] 07/22/2022, 7:59 AM   If 7PM-7AM, please contact night-coverage [email or web address]

## 2022-07-22 NOTE — Evaluation (Signed)
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 Underwent L small finger excision of skin and subcutaneous tissue with prox - distal irrigation due infection. PMH: L long finger partial amputation   Clinical Impression   Pt sleeping on arrival however able to awaken and participate with OT.  Tolerated L little finger and L hand ROM as indicated below. Given level 1 foam block in glove to work on actively moving L hand/little finger. Educated about importance of keeping L hand elevated with use of ice. Tolerated well. Pt is homeless and will need to follow up with therapy at the neuro outpt center [facility]) if transportation can be arranged. Acute OT to follow.      Recommendations for follow up therapy are one component of a multi-disciplinary discharge planning process, led by the attending physician.  Recommendations may be updated based on patient status, additional functional criteria and insurance authorization.   Assistance Recommended at Discharge PRN  Patient can return home with the following      Functional Status Assessment  Patient has had a recent decline in their functional status and demonstrates the ability to make significant improvements in function in a reasonable and predictable amount of time.  Equipment Recommendations  None recommended by OT    Recommendations for Other Services       Precautions / Restrictions Precautions Precautions: Other (comment) (L finger open wound)      Mobility Bed Mobility Overal bed mobility: Independent                  Transfers Overall transfer level: Independent                        Balance                                           ADL either performed or  assessed with clinical judgement   ADL Overall ADL's : Modified independent;Needs assistance/impaired                                     Functional mobility during ADLs: Modified independent General ADL Comments: Pt overall able to copmlete ADL tasks; assistance for opening packages     Vision         Perception     Praxis      Pertinent Vitals/Pain Pain Assessment Pain Assessment: 0-10 Pain Score: 6  Pain Location: L hand however falling asleep Pain Descriptors / Indicators: Discomfort, Grimacing, Guarding Pain Intervention(s): Limited activity within patient's tolerance     Hand Dominance     Extremity/Trunk Assessment Upper Extremity Assessment Upper Extremity Assessment: LUE deficits/detail LUE Deficits / Details: No dressing on little finger; 4x4 gauze used to help hold finger for exercise; unable to make full fist. holding L finger in extension. ROM improves after passive stretch. No dressing on finger,. After self ROM/passive stretch, able to achieve @ 45 degrees flex/ext on MP and PIP. unable to tolerate DIP. able to touch tips of other digits to palm LUE Coordination: decreased fine motor   Lower Extremity Assessment Lower Extremity  Assessment: Overall WFL for tasks assessed       Communication     Cognition Arousal/Alertness: Lethargic, Suspect due to medications Behavior During Therapy: Flat affect Overall Cognitive Status: Within Functional Limits for tasks assessed                                 General Comments: most likely baseline     General Comments       Exercises Exercises: Other exercises, Hand exercises Hand Exercises Digit Composite Flexion: Left, 15 reps (as tolerated) Composite Extension: Left, 15 reps Other Exercises Other Exercises: L MP, PIP  and attempts at DIP A/AA PROM Other Exercises: yellow foam block placed in glove and given to pt; arm elevated; ice pack to hand to decrease edem - encouraged  frequent digit ROM   Shoulder Instructions      Home Living Family/patient expects to be discharged to:: Shelter/Homeless                                        Prior Functioning/Environment Prior Level of Function : Independent/Modified Independent                        OT Problem List: Decreased strength;Decreased range of motion;Impaired UE functional use;Pain;Increased edema      OT Treatment/Interventions: Self-care/ADL training;Therapeutic exercise;Therapeutic activities;DME and/or AE instruction;Patient/family education    OT Goals(Current goals can be found in the care plan section) Acute Rehab OT Goals Patient Stated Goal: none stated OT Goal Formulation: With patient Time For Goal Achievement: 08/05/22 Potential to Achieve Goals: Good  OT Frequency: Min 4X/week    Co-evaluation              AM-PAC OT "6 Clicks" Daily Activity     Outcome Measure Help from another person eating meals?: A Little Help from another person taking care of personal grooming?: A Little Help from another person toileting, which includes using toliet, bedpan, or urinal?: None Help from another person bathing (including washing, rinsing, drying)?: A Little Help from another person to put on and taking off regular upper body clothing?: A Little Help from another person to put on and taking off regular lower body clothing?: A Little 6 Click Score: 19   End of Session Nurse Communication: Precautions  Activity Tolerance: Patient tolerated treatment well Patient left: in bed;with call bell/phone within reach  OT Visit Diagnosis: Pain;Muscle weakness (generalized) (M62.81) Pain - Right/Left: Left Pain - part of body: Hand                Time: 1610-9604 OT Time Calculation (min): 26 min Charges:  OT General Charges $OT Visit: 1 Visit OT Evaluation $OT Eval Moderate Complexity: 1 Mod OT Treatments $Therapeutic Exercise: 8-22 mins  Luisa Dago, OT/L    Acute OT Clinical Specialist Acute Rehabilitation Services Pager [phone] Office [phone]   [facility] 07/22/2022, 3:33 PM

## 2022-07-22 NOTE — Plan of Care (Addendum)
@  0740 Attempt to remove packing and change dressing. Patient continues to refused. Will attempt again later today. Patient educated on the need of changing dressing. Patient also still refusing telemetry.   @0850  Attempt to remove packing/dressing again, educated patient it is very important to remove dressing or it may cause further problems, educated we could give pain meds prior to to help, patient continues to refused.  Problem: Education: Goal: Knowledge of General Education information will improve Description: Including pain rating scale, medication(s)/side effects and non-pharmacologic comfort measures Outcome: Progressing   Problem: Activity: Goal: Risk for activity intolerance will decrease Outcome: Progressing   Problem: Pain Managment: Goal: General experience of comfort will improve Outcome: Progressing   Problem: Safety: Goal: Ability to remain free from injury will improve Outcome: Progressing   Problem: Skin Integrity: Goal: Risk for impaired skin integrity will decrease Outcome: Progressing

## 2022-07-22 NOTE — Progress Notes (Addendum)
Attempted dressing change as ordered, while removing old dressings, patient stopped this nurse and refused to continue anymore, called [facility] and informed receptionist to relay message to on-call MD regarding refusal and if we can soak old dressing to saline for easier removal.  Attempted again to do dressing change after soaking dry dressing to saline and giving morphine, during removal of packing strip, patient screamed and pulled her hands and stated "Aint no way, its painful, if you will do that they have to put me to sleep again, stop it, I dont want anymore". Dr. Janee Morn made aware through [facility] receptionist.  At 0700, Dr. Janee Morn called and informed that this nurse is unable to remove packing as patient has pulled her hand away and refuses to have packing removes, Dr. Janee Morn stated that packing needs to remove fast, informed him that this nurse can't even attempt to fully hold and pull packing in a regular manner as patient has already pulled her hand. Per Dr. Janee Morn, packing needs to be removed or patient will have amputation, relayed to dayshift nurse Lauren about care of patient and dressing change problems.

## 2022-07-23 DIAGNOSIS — A4902 Methicillin resistant Staphylococcus aureus infection, unspecified site: Secondary | ICD-10-CM | POA: Insufficient documentation

## 2022-07-23 DIAGNOSIS — B9562 Methicillin resistant Staphylococcus aureus infection as the cause of diseases classified elsewhere: Secondary | ICD-10-CM

## 2022-07-23 LAB — COMPREHENSIVE METABOLIC PANEL
ALT: 78 U/L — ABNORMAL HIGH (ref 0–44)
AST: 49 U/L — ABNORMAL HIGH (ref 15–41)
Albumin: 2.7 g/dL — ABNORMAL LOW (ref 3.5–5.0)
Alkaline Phosphatase: 93 U/L (ref 38–126)
Anion gap: 9 (ref 5–15)
BUN: 16 mg/dL (ref 6–20)
CO2: 24 mmol/L (ref 22–32)
Calcium: 8.7 mg/dL — ABNORMAL LOW (ref 8.9–10.3)
Chloride: 100 mmol/L (ref 98–111)
Creatinine, Ser: 0.92 mg/dL (ref 0.44–1.00)
GFR, Estimated: >60 mL/min (ref >60)
Glucose, Bld: 108 mg/dL — ABNORMAL HIGH (ref 70–99)
Potassium: 4.1 mmol/L (ref 3.5–5.1)
Sodium: 133 mmol/L — ABNORMAL LOW (ref 135–145)
Total Bilirubin: 0.4 mg/dL (ref 0.3–1.2)
Total Protein: 6.2 g/dL — ABNORMAL LOW (ref 6.5–8.1)

## 2022-07-23 LAB — CBC
HCT: 37 % (ref 36.0–46.0)
Hemoglobin: 12.1 g/dL (ref 12.0–15.0)
MCH: 29.4 pg (ref 26.0–34.0)
MCHC: 32.7 g/dL (ref 30.0–36.0)
MCV: 90 fL (ref 80.0–100.0)
Platelets: 427 10*3/uL — ABNORMAL HIGH (ref 150–400)
RBC: 4.11 MIL/uL (ref 3.87–5.11)
RDW: 14 % (ref 11.5–15.5)
WBC: 8.4 10*3/uL (ref 4.0–10.5)
nRBC: 0 % (ref 0.0–0.2)

## 2022-07-23 LAB — AEROBIC/ANAEROBIC CULTURE W GRAM STAIN (SURGICAL/DEEP WOUND)

## 2022-07-23 NOTE — Consult Note (Signed)
[facility]    Date of Admission:  07/20/2022     Total days of antibiotics 4               Reason for Consult: Finger infection  Referring Provider: Dr. Jacquelin Hawking Primary Care Provider: Centricity, User, MD (Inactive)   ASSESSMENT:  [name] is a 37 y/o female admitted with left 5th finger redness and swelling and found to have MRSA infection s/p debridement and currently POD #2. There is concern for osteomyelitis will need a prolonged course of antibiotic therapy along with continued wound care. Narrow antibiotics to vancomycin. Therapeutic drug monitoring of renal function and vancomycin levels. Continue with IV vancomycin for now and pending disposition will make final antibiotic plan for route and duration. Remaining medical and supportive care per Internal Medicine.   PLAN:  Narrow antibiotics to vancomycin.  Therapeutic drug monitoring of renal function and vancomycin levels.  Post-operative wound care per Orthopedics Remaining medical and supportive care per Internal Medicine.   I have personally spent 27 minutes involved in face-to-face and non-face-to-face activities for this patient on the day of the visit. Professional time spent includes the following activities: Preparing to see the patient (review of tests), Obtaining and/or reviewing separately obtained history (admission/discharge record), Performing a medically appropriate examination and/or evaluation , Ordering medications/tests/procedures, referring and communicating with other health care professionals, Documenting clinical information in the EMR, Independently interpreting results (not separately reported), Communicating results to the patient/family/caregiver, Counseling and educating the patient/family/caregiver and Care coordination (not separately reported).    Principal Problem:   Finger infection Active Problems:   Thrombocytosis   Leukocytosis   Polysubstance abuse  (HCC)   MRSA infection    acetaminophen  650 mg Oral Q6H   meloxicam  15 mg Oral Daily   sodium chloride flush  3 mL Intravenous Q12H     HPI: [name] is a 37 y.o. female with previous medical history as below presenting to the hospital with left hand fifth finger pain and swelling.  [name] presented with a couple of day history of swelling, pain, limited range of motion of the left fifth finger that was not controlled by acetaminophen along with subjective fevers. She does use crack cocaine with last use the night prior to arrival. X-ray left fifth finger with severe soft tissue swelling and no osseus abnormality. Afebrile since admission with initial leukocytosis of 14.5 now improved to 8.4 s/p left 5th finger debridement on 07/21/22. POD #2 and Day 4 of antimicrobial therapy with vancomycin and piperacillin-tazobactam. Surgical specimen obtained with gram positive cocci on gram stain and MRSA on culture with resistance to trimethoprim-sulfamethoxazole. Continues to have post-operative pain with some redness and swelling. ID has been consulted for antibiotic recommendations.   Review of Systems: Review of Systems  Constitutional:  Negative for chills, fever and weight loss.  Respiratory:  Negative for cough, shortness of breath and wheezing.   Cardiovascular:  Negative for chest pain and leg swelling.  Gastrointestinal:  Negative for abdominal pain, constipation, diarrhea, nausea and vomiting.  Skin:  Negative for rash.     Past Medical History:  Diagnosis Date   Substance abuse (HCC)     Social History   Tobacco Use   Smoking status: Every Day    Packs/day: 1    Types: Cigarettes   Smokeless tobacco: Never  Substance Use Topics   Alcohol use: Yes    Comment: daily-- close to a fifth  and 7 beers (16 ounce)   Drug use: Yes    Types: Cocaine    History reviewed. No pertinent family history.  No Known Allergies  OBJECTIVE: Blood pressure 105/68, pulse  79, temperature 98.5 F (36.9 C), temperature source Oral, resp. rate 16, height 4' 11.02" (1.499 m), weight 56.7 kg, SpO2 98 %.  Physical Exam Constitutional:      General: She is not in acute distress.    Appearance: She is well-developed.  Cardiovascular:     Rate and Rhythm: Normal rate and regular rhythm.     Heart sounds: Normal heart sounds.  Pulmonary:     Effort: Pulmonary effort is normal.     Breath sounds: Normal breath sounds.  Skin:    General: Skin is warm and dry.  Neurological:     Mental Status: She is alert.  Psychiatric:        Mood and Affect: Mood normal.     Lab Results Lab Results  Component Value Date   WBC 8.4 07/23/2022   HGB 12.1 07/23/2022   HCT 37.0 07/23/2022   MCV 90.0 07/23/2022   PLT 427 (H) 07/23/2022    Lab Results  Component Value Date   CREATININE 0.92 07/23/2022   BUN 16 07/23/2022   NA 133 (L) 07/23/2022   K 4.1 07/23/2022   CL 100 07/23/2022   CO2 24 07/23/2022    Lab Results  Component Value Date   ALT 78 (H) 07/23/2022   AST 49 (H) 07/23/2022   ALKPHOS 93 07/23/2022   BILITOT 0.4 07/23/2022     Microbiology: Recent Results (from the past 240 hour(s))  Blood culture (routine x 2)     Status: None (Preliminary result)   Collection Time: 07/20/22  8:54 AM   Specimen: BLOOD  Result Value Ref Range Status   Specimen Description BLOOD SITE NOT SPECIFIED  Final   Special Requests   Final    BOTTLES DRAWN AEROBIC AND ANAEROBIC Blood Culture results may not be optimal due to an inadequate volume of blood received in culture bottles   Culture   Final    NO GROWTH 3 DAYS Performed at [facility], [address] [place], [place] [address]    Report Status PENDING  Incomplete  Blood culture (routine x 2)     Status: None (Preliminary result)   Collection Time: 07/20/22  9:18 AM   Specimen: BLOOD  Result Value Ref Range Status   Specimen Description BLOOD SITE NOT SPECIFIED  Final   Special Requests   Final     BOTTLES DRAWN AEROBIC AND ANAEROBIC Blood Culture adequate volume   Culture   Final    NO GROWTH 3 DAYS Performed at [facility], [address], [place], [place] [address]    Report Status PENDING  Incomplete  Aerobic/Anaerobic Culture w Gram Stain (surgical/deep wound)     Status: None (Preliminary result)   Collection Time: 07/21/22 10:13 AM   Specimen: Finger, Left; Abscess  Result Value Ref Range Status   Specimen Description ABSCESS  Final   Special Requests LEFT SMALL FINGER, ZOSYN, VANCOMYCIN  Final   Gram Stain   Final    RARE WBC PRESENT, PREDOMINANTLY MONONUCLEAR FEW GRAM POSITIVE COCCI IN PAIRS IN CLUSTERS Performed at [facility], [address], [place], [place] [address]    Culture   Final    ABUNDANT METHICILLIN RESISTANT STAPHYLOCOCCUS AUREUS NO ANAEROBES ISOLATED; CULTURE IN PROGRESS FOR 5 DAYS  Report Status PENDING  Incomplete   Organism ID, Bacteria METHICILLIN RESISTANT STAPHYLOCOCCUS AUREUS  Final      Susceptibility   Methicillin resistant staphylococcus aureus - MIC*    CIPROFLOXACIN >=8 RESISTANT Resistant     ERYTHROMYCIN >=8 RESISTANT Resistant     GENTAMICIN <=0.5 SENSITIVE Sensitive     OXACILLIN >=4 RESISTANT Resistant     TETRACYCLINE <=1 SENSITIVE Sensitive     VANCOMYCIN <=0.5 SENSITIVE Sensitive     TRIMETH/SULFA >=320 RESISTANT Resistant     CLINDAMYCIN <=0.25 SENSITIVE Sensitive     RIFAMPIN <=0.5 SENSITIVE Sensitive     Inducible Clindamycin NEGATIVE Sensitive     LINEZOLID 2 SENSITIVE Sensitive     * ABUNDANT METHICILLIN RESISTANT STAPHYLOCOCCUS AUREUS     Marcos Eke, NP [facility]  07/23/2022  2:28 PM

## 2022-07-23 NOTE — Progress Notes (Signed)
PROGRESS NOTE    [name]  KGM:[id number] DOB: June 08, 1985 DOA: 07/20/2022 PCP: Centricity, User, MD (Inactive)   Brief Narrative: [name] is a 37 y.o. female with a history of polysubstance abuse.  Patient presented secondary to pain and swelling of her left finger after insect bite and was found to have concern for acute infection.  Evidence of sepsis on admission as well.  Empiric antibiotics started and blood cultures obtained.  Orthopedic surgery was consulted and performed excision of skin/subcutaneous tissue with proximals-distal irrigation on 5/26.  Wound cultures pending.   Assessment and Plan:  Finger infection Left fifth digit.  Blood cultures obtained.  Empiric vancomycin and Zosyn started on admission.  Orthopedic surgery consulted on admission and performed an excision of skin/subcutaneous tissue with proximal-distal irrigation on 5/26.  Recommendations from orthopedic surgery for continued IV antibiotics and to follow-up wound cultures. Wound culture significant for methicillin resistant staphylococcus aureus. -Continue vancomycin; discontinue Zosyn IV -ID consult  Sepsis Present on admission.  Source is her finger infection and secondary to MRSA seen on wound culture.  Patient started empirically on antibiotics on admission with blood cultures obtained.  Blood cultures so far no growth to date.  Leukocytosis is resolved.  Elevated AST/ALT Unclear etiology. Possibly related to acute infection. Trending down.  Thrombocytosis Mildly elevated platelets of 406,000 on admission.  Likely reactive and secondary to acute infection.    Polysubstance abuse Patient has a history of tobacco cocaine, marijuana use.  Current toxicology screen was significant for positive cocaine and THC.  Toxicology screen also positive for opiates, however patient received morphine prior to toxicology screen.   DVT prophylaxis: SCDs Code Status:   Code Status: Full  Code Family Communication: None at bedside Disposition Plan: Discharge likely in 1-2 days pending ID recommendations and transition to outpatient antibiotics   Consultants:  Orthopedic surgery  Procedures:  5/26: Left small finger excision of skin and subcutaneous tissue, with prox-distal irrigation   Antimicrobials: Vancomycin IV Zosyn IV   Subjective: Patient reports no specific concerns from overnight or this morning.  Objective: BP (!) 111/53 (BP Location: Right Arm)   Pulse 98   Temp 99 F (37.2 C)   Resp 17   Ht 4' 11.02" (1.499 m)   Wt 56.7 kg   SpO2 (!) 87%   BMI 25.23 kg/m   Examination:  General exam: Appears calm and comfortable Respiratory system: Clear to auscultation. Respiratory effort normal. Cardiovascular system: S1 & S2 heard, RRR. Gastrointestinal system: Abdomen is nondistended, soft and nontender. Normal bowel sounds heard. Central nervous system: Alert and oriented. No focal neurological deficits. Musculoskeletal: Left fifth digit appears swollen with dried blood.  Skin: No cyanosis. No rashes Psychiatry: Judgement and insight appear normal. Mood & affect appropriate.    Data Reviewed: I have personally reviewed following labs and imaging studies  CBC Lab Results  Component Value Date   WBC 8.4 07/23/2022   RBC 4.11 07/23/2022   HGB 12.1 07/23/2022   HCT 37.0 07/23/2022   MCV 90.0 07/23/2022   MCH 29.4 07/23/2022   PLT 427 (H) 07/23/2022   MCHC 32.7 07/23/2022   RDW 14.0 07/23/2022   LYMPHSABS 2.2 07/20/2022   MONOABS 2.0 (H) 07/20/2022   EOSABS 0.2 07/20/2022   BASOSABS 0.0 07/20/2022     Last metabolic panel Lab Results  Component Value Date   NA 133 (L) 07/23/2022   K 4.1 07/23/2022   CL 100 07/23/2022   CO2 24 07/23/2022  BUN 16 07/23/2022   CREATININE 0.92 07/23/2022   GLUCOSE 108 (H) 07/23/2022   GFRNONAA >60 07/23/2022   GFRAA >60 10/14/2019   CALCIUM 8.7 (L) 07/23/2022   PROT 6.2 (L) 07/23/2022   ALBUMIN 2.7  (L) 07/23/2022   BILITOT 0.4 07/23/2022   ALKPHOS 93 07/23/2022   AST 49 (H) 07/23/2022   ALT 78 (H) 07/23/2022   ANIONGAP 9 07/23/2022    GFR: Estimated Creatinine Clearance: 64.9 mL/min (by C-G formula based on SCr of 0.92 mg/dL).  Recent Results (from the past 240 hour(s))  Blood culture (routine x 2)     Status: None (Preliminary result)   Collection Time: 07/20/22  8:54 AM   Specimen: BLOOD  Result Value Ref Range Status   Specimen Description BLOOD SITE NOT SPECIFIED  Final   Special Requests   Final    BOTTLES DRAWN AEROBIC AND ANAEROBIC Blood Culture results may not be optimal due to an inadequate volume of blood received in culture bottles   Culture   Final    NO GROWTH 3 DAYS Performed at [facility], [address], [place], [place] [address]    Report Status PENDING  Incomplete  Blood culture (routine x 2)     Status: None (Preliminary result)   Collection Time: 07/20/22  9:18 AM   Specimen: BLOOD  Result Value Ref Range Status   Specimen Description BLOOD SITE NOT SPECIFIED  Final   Special Requests   Final    BOTTLES DRAWN AEROBIC AND ANAEROBIC Blood Culture adequate volume   Culture   Final    NO GROWTH 3 DAYS Performed at [facility], [address], [place], [place] [address]    Report Status PENDING  Incomplete  Aerobic/Anaerobic Culture w Gram Stain (surgical/deep wound)     Status: None (Preliminary result)   Collection Time: 07/21/22 10:13 AM   Specimen: Finger, Left; Abscess  Result Value Ref Range Status   Specimen Description ABSCESS  Final   Special Requests LEFT SMALL FINGER, ZOSYN, VANCOMYCIN  Final   Gram Stain   Final    RARE WBC PRESENT, PREDOMINANTLY MONONUCLEAR FEW GRAM POSITIVE COCCI IN PAIRS IN CLUSTERS Performed at [facility], [address] [place], [place] [address]    Culture   Final    ABUNDANT METHICILLIN RESISTANT STAPHYLOCOCCUS AUREUS   Report Status PENDING  Incomplete   Organism ID, Bacteria  METHICILLIN RESISTANT STAPHYLOCOCCUS AUREUS  Final      Susceptibility   Methicillin resistant staphylococcus aureus - MIC*    CIPROFLOXACIN >=8 RESISTANT Resistant     ERYTHROMYCIN >=8 RESISTANT Resistant     GENTAMICIN <=0.5 SENSITIVE Sensitive     OXACILLIN >=4 RESISTANT Resistant     TETRACYCLINE <=1 SENSITIVE Sensitive     VANCOMYCIN <=0.5 SENSITIVE Sensitive     TRIMETH/SULFA >=320 RESISTANT Resistant     CLINDAMYCIN <=0.25 SENSITIVE Sensitive     RIFAMPIN <=0.5 SENSITIVE Sensitive     Inducible Clindamycin NEGATIVE Sensitive     LINEZOLID 2 SENSITIVE Sensitive     * ABUNDANT METHICILLIN RESISTANT STAPHYLOCOCCUS AUREUS      Radiology Studies: No results found.    LOS: 3 days    Jacquelin Hawking, MD [facility] 07/23/2022, 9:35 AM   If 7PM-7AM, please contact night-coverage [email or web address]

## 2022-07-23 NOTE — Progress Notes (Signed)
Occupational Therapy Note  Making good progress with L little finger ROM as noted below. Will continue to follow.     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 Underwent L small finger excision of skin and subcutaneous tissue with prox - distal irrigation due infection. PMH: L long finger partial amputation  Precautions  Precaution Comments L little finger wound  Pain Assessment  Pain Assessment Faces  Faces Pain Scale 6  Pain Location L hand with digit ROM  Pain Descriptors / Indicators Discomfort;Grimacing;Guarding  Pain Intervention(s) Limited activity within patient's tolerance;RN gave pain meds during session;Patient requesting pain meds-RN notified  Cognition  Arousal/Alertness Awake/alert  Behavior During Therapy WFL for tasks assessed/performed  Overall Cognitive Status Within Functional Limits for tasks assessed  Upper Extremity Assessment  Upper Extremity Assessment LUE deficits/detail  LUE Deficits / Details improved ROM this date. HOlding L little finger in extension however able to achieve @ 90 MP flex; 20 PIP flex and 10 DIP flex during AA/PROM. Using foam squeeze in glove  LUE Coordination decreased fine motor  ADL  General ADL Comments Pt overall able to copmlete ADL tasks; assistance for opening packages  Exercises  Exercises Other exercises;Hand exercises  Hand Exercises  Digit Composite Flexion Left;15 reps  Composite Extension Left;15 reps  Digit Composite Abduction AAROM;AROM;Right;10 reps  Digit Composite Adduction AROM;AAROM;Left;10 reps  Thumb Abduction AROM;Left;5 reps  Thumb Adduction AROM;AAROM;Left;10 reps  Opposition AROM;AAROM;10 reps  Other Exercises  Other Exercises isolated PIP and DIP flex/ext with MP joint blocked  OT - End of Session  Activity Tolerance  Patient tolerated treatment well  Patient left in bed;with call bell/phone within reach  Nurse Communication Other (comment) (progress of L hand)  OT Assessment/Plan  OT Plan Discharge plan remains appropriate  OT Visit Diagnosis Pain;Muscle weakness (generalized) (M62.81)  Pain - Right/Left Left  Pain - part of body Hand  OT Frequency (ACUTE ONLY) Min 4X/week  Follow Up Recommendations Outpatient OT  Assistance recommended at discharge PRN  AM-PAC OT "6 Clicks" Daily Activity Outcome Measure (Version 2)  Help from another person eating meals? 3  Help from another person taking care of personal grooming? 3  Help from another person toileting, which includes using toliet, bedpan, or urinal? 4  Help from another person bathing (including washing, rinsing, drying)? 3  Help from another person to put on and taking off regular upper body clothing? 3  Help from another person to put on and taking off regular lower body clothing? 3  6 Click Score 19  Progressive Mobility  What is the highest level of mobility based on the progressive mobility assessment? Level 6 (Walks independently in room and hall) - Balance while walking in room without assist - Complete  Mobility Referral Yes  OT Goal Progression  Progress towards OT goals Progressing toward goals  Acute Rehab OT Goals  Patient Stated Goal to move her hand better  OT Goal Formulation With patient  Time For Goal Achievement 08/05/22  Potential to Achieve Goals Good  ADL Goals  Pt/caregiver will Perform Home Exercise Program Increased ROM;With written HEP provided;Independently;Left upper extremity  Additional ADL Goal #1 Pt will comlete composite flexion  and incorporate L little finger to achieve full grasp 1 inch from distal plamer crease to improve funcitonal use of hand  OT  Time Calculation  OT Start Time (ACUTE ONLY) 1025  OT Stop Time (ACUTE ONLY) 1050  OT Time Calculation (min) 25 min  OT General Charges  $OT Visit 1 Visit   OT Treatments  $Therapeutic Exercise 23-37 mins   Luisa Dago, OT/L   Acute OT Clinical Specialist Acute Rehabilitation Services Pager [phone] Office [phone]

## 2022-07-23 NOTE — Plan of Care (Signed)

## 2022-07-23 NOTE — Progress Notes (Signed)
Pharmacy Antibiotic Note  [name] is a 37 y.o. female admitted on 07/20/2022 with  flexor tenosynovitis .  Pharmacy has been consulted for vancomycin dosing.   Plan: Continue vancomycin 1g IV q24h (eAUC 427, Scr 0.83) Monitor clinical progress, c/s, renal function F/u de-escalation plan/LOT, vancomycin levels as indicated Stop zosyn per MD as MRSA in wound culture  Height: 4' 11.02" (149.9 cm) Weight: 56.7 kg (125 lb) IBW/kg (Calculated) : 43.24  Temp (24hrs), Avg:98.8 F (37.1 C), Min:98.5 F (36.9 C), Max:99 F (37.2 C)  Recent Labs  Lab 07/20/22 0810 07/20/22 1000 07/21/22 0247 07/22/22 0329 07/22/22 1020 07/23/22 0231  WBC  --  13.2* 14.5* 12.1*  --  8.4  CREATININE 0.83  --  1.08*  --  0.79 0.92  LATICACIDVEN 0.8 0.7  --   --   --   --     Estimated Creatinine Clearance: 64.9 mL/min (by C-G formula based on SCr of 0.92 mg/dL).    No Known Allergies  Antimicrobials this admission: 5/25 vancomycin >>  5/25 zosyn >> 5/28  Dose adjustments this admission:   Microbiology results: 5/25 [facility] x2: ngtd 5/25 BCx x2: ngtd  5/26 surgical wound: MRSA  Rexford Maus, PharmD, BCPS 07/23/2022 12:56 PM

## 2022-07-23 NOTE — Progress Notes (Addendum)
  PATIENT ID: [name]  MRN: [id number]  DOB/AGE:  11/05/1985 / 37 y.o.  07-21-22 I&D L SF  Subjective: Pain is found soundly sleeping, requiring me to awaken her to examine her finger.  Reports she has been moving the finger, and it maybe hurts a little less. No c/o chest pain or SOB.  No dressing in place    Objective: Vital signs in last 24 hours: Temp:  [98.5 F (36.9 C)-99 F (37.2 C)] 99 F (37.2 C) (05/27 2014) Pulse Rate:  [81-98] 98 (05/27 2014) Resp:  [17-18] 17 (05/27 2014) BP: (100-124)/(53-88) 111/53 (05/27 2014) SpO2:  [87 %-99 %] 87 % (05/27 2014)  Intake/Output from previous day: No intake/output data recorded. Intake/Output this shift: No intake/output data recorded.  Recent Labs    07/20/22 1000 07/21/22 0247 07/22/22 0329 07/23/22 0231  HGB 12.7 12.2 12.2 12.1    Recent Labs    07/22/22 0329 07/23/22 0231  WBC 12.1* 8.4  RBC 4.04 4.11  HCT 36.8 37.0  PLT 394 427*    Recent Labs    07/22/22 1020 07/23/22 0231  NA 135 133*  K 3.7 4.1  CL 101 100  CO2 25 24  BUN 13 16  CREATININE 0.79 0.92  GLUCOSE 97 108*  CALCIUM 8.6* 8.7*    No results for input(s): "LABPT", "INR" in the last 72 hours.  Physical Exam: L SF still swollen, dermis at tip dark, firm.  Both sites that were open are barely open now, but no expressible drainage with some pressure near the sealing wounds. Sitting nearly fully extended.  Reluctant to do AROM with digit, but waves it some, moving at MCP  WBC down to 8.4 Surgical cx Gm stain = GPC in pairs; Cx abundant staph aureus  Assessment/Plan: 2 Days Post-Op    Digit skin is a little dry now, and the sites of I&D are sealing/sealed. Will start warm water soaks, allow cleansing with soap/water in shower.  Dressings, if needed, can be soaked to help in changing as necessary.  Not ready for d/c yet. I will re-assess later today, or in AM.  Given this severe L SF infection with possibility of early osteomyelitis,  recommend ID consult to determine best outpatient med, delivery method, duration, etc since hospitalist service has no outpatient clinic to continue to evaluate patient for infection resolution, modification of antibiotics, decision to stop antibiotics, etc as those decisions are outside my field of expertise.    Upon discharge, I will oversee therapy/rehab/functional recovery.  Cliffton Asters Janee Morn, MD      Orthopaedic & Hand Surgery [facility] Orthopaedic & Sports Medicine [facility] [address] [place], [place]  [address] Office: [phone]  07/23/2022, 6:33 AM

## 2022-07-24 ENCOUNTER — Encounter (HOSPITAL_COMMUNITY)
Admission: EM | Disposition: A | Discharge status: Home / Self Care | Payer: Self-pay | Source: Home / Self Care | Attending: Internal Medicine

## 2022-07-24 ENCOUNTER — Inpatient Hospital Stay (HOSPITAL_COMMUNITY): Payer: Self-pay | Admitting: Anesthesiology

## 2022-07-24 ENCOUNTER — Inpatient Hospital Stay (HOSPITAL_COMMUNITY): Payer: Self-pay

## 2022-07-24 DIAGNOSIS — F17210 Nicotine dependence, cigarettes, uncomplicated: Secondary | ICD-10-CM

## 2022-07-24 DIAGNOSIS — L089 Local infection of the skin and subcutaneous tissue, unspecified: Secondary | ICD-10-CM

## 2022-07-24 DIAGNOSIS — M659 Synovitis and tenosynovitis, unspecified: Secondary | ICD-10-CM

## 2022-07-24 HISTORY — PX: AMPUTATION: SHX166

## 2022-07-24 LAB — URINALYSIS, W/ REFLEX TO CULTURE (INFECTION SUSPECTED)
Bilirubin Urine: NEGATIVE
Glucose, UA: NEGATIVE mg/dL
Hgb urine dipstick: NEGATIVE
Ketones, ur: NEGATIVE mg/dL
Leukocytes,Ua: NEGATIVE
Nitrite: NEGATIVE
Protein, ur: NEGATIVE mg/dL
Specific Gravity, Urine: 1.012 (ref 1.005–1.030)
pH: 6 (ref 5.0–8.0)

## 2022-07-24 LAB — VANCOMYCIN, PEAK: Vancomycin Pk: 26 ug/mL — ABNORMAL LOW (ref 30–40)

## 2022-07-24 SURGERY — AMPUTATION DIGIT
Anesthesia: General | Site: Little Finger | Laterality: Left

## 2022-07-24 MED ORDER — BACITRACIN ZINC 500 UNIT/GM EX OINT
TOPICAL_OINTMENT | CUTANEOUS | Status: AC
  Filled 2022-07-24: qty 28.35

## 2022-07-24 MED ORDER — OXYCODONE HCL 5 MG PO TABS
5.0000 mg | ORAL_TABLET | Freq: Once | ORAL | Status: AC
  Administered 2022-07-24: 5 mg via ORAL
  Filled 2022-07-24: qty 1

## 2022-07-24 MED ORDER — BUPIVACAINE-EPINEPHRINE (PF) 0.5% -1:200000 IJ SOLN
INTRAMUSCULAR | Status: AC
  Filled 2022-07-24: qty 30

## 2022-07-24 MED ORDER — OXYCODONE HCL 5 MG/5ML PO SOLN: 5.0000 mg | Freq: Once | ORAL | Status: DC | PRN

## 2022-07-24 MED ORDER — ACETAMINOPHEN 325 MG PO TABS: 325.0000 mg | ORAL_TABLET | ORAL | Status: DC | PRN

## 2022-07-24 MED ORDER — ACETAMINOPHEN 10 MG/ML IV SOLN: 1000.0000 mg | Freq: Once | INTRAVENOUS | Status: DC | PRN

## 2022-07-24 MED ORDER — FENTANYL CITRATE (PF) 250 MCG/5ML IJ SOLN
INTRAMUSCULAR | Status: AC
  Filled 2022-07-24: qty 5

## 2022-07-24 MED ORDER — FENTANYL CITRATE (PF) 100 MCG/2ML IJ SOLN
25.0000 ug | INTRAMUSCULAR | Status: DC | PRN
  Administered 2022-07-24 (×3): 50 ug via INTRAVENOUS

## 2022-07-24 MED ORDER — OXYCODONE HCL 5 MG PO TABS: 5.0000 mg | ORAL_TABLET | Freq: Once | ORAL | Status: DC | PRN

## 2022-07-24 MED ORDER — MIDAZOLAM HCL 2 MG/2ML IJ SOLN
INTRAMUSCULAR | Status: AC
  Filled 2022-07-24: qty 2

## 2022-07-24 MED ORDER — ONDANSETRON HCL 4 MG/2ML IJ SOLN
INTRAMUSCULAR | Status: DC | PRN
  Administered 2022-07-24: 4 mg via INTRAVENOUS

## 2022-07-24 MED ORDER — AMISULPRIDE (ANTIEMETIC) 5 MG/2ML IV SOLN: 10.0000 mg | Freq: Once | INTRAVENOUS | Status: DC | PRN

## 2022-07-24 MED ORDER — SUCCINYLCHOLINE CHLORIDE 20 MG/ML IJ SOLN
INTRAMUSCULAR | Status: DC | PRN
  Administered 2022-07-24: 120 mg via INTRAVENOUS

## 2022-07-24 MED ORDER — HYDROMORPHONE HCL 1 MG/ML IJ SOLN
INTRAMUSCULAR | Status: AC
  Filled 2022-07-24: qty 1

## 2022-07-24 MED ORDER — PROPOFOL 10 MG/ML IV BOLUS
INTRAVENOUS | Status: DC | PRN
  Administered 2022-07-24: 130 mg via INTRAVENOUS

## 2022-07-24 MED ORDER — DEXAMETHASONE SODIUM PHOSPHATE 4 MG/ML IJ SOLN
INTRAMUSCULAR | Status: DC | PRN
  Administered 2022-07-24: 10 mg via INTRAVENOUS

## 2022-07-24 MED ORDER — LIDOCAINE HCL (PF) 1 % IJ SOLN
INTRAMUSCULAR | Status: AC
  Filled 2022-07-24: qty 30

## 2022-07-24 MED ORDER — BACITRACIN ZINC 500 UNIT/GM EX OINT
TOPICAL_OINTMENT | CUTANEOUS | Status: DC | PRN
  Administered 2022-07-24: 1 via TOPICAL

## 2022-07-24 MED ORDER — BACITRACIN ZINC 500 UNIT/GM EX OINT
1.0000 | TOPICAL_OINTMENT | Freq: Every day | CUTANEOUS | Status: DC
  Administered 2022-07-25 – 2022-07-27 (×3): 1 via TOPICAL
  Filled 2022-07-24 (×2): qty 28.4

## 2022-07-24 MED ORDER — LABETALOL HCL 5 MG/ML IV SOLN
5.0000 mg | Freq: Once | INTRAVENOUS | Status: AC
  Administered 2022-07-24: 5 mg via INTRAVENOUS

## 2022-07-24 MED ORDER — LACTATED RINGERS IV SOLN: INTRAVENOUS | Status: DC | PRN

## 2022-07-24 MED ORDER — LIDOCAINE HCL (CARDIAC) PF 50 MG/5ML IV SOSY
PREFILLED_SYRINGE | INTRAVENOUS | Status: DC | PRN
  Administered 2022-07-24: 40 mg via INTRAVENOUS

## 2022-07-24 MED ORDER — FENTANYL CITRATE (PF) 100 MCG/2ML IJ SOLN
INTRAMUSCULAR | Status: AC
  Filled 2022-07-24: qty 2

## 2022-07-24 MED ORDER — HYDROMORPHONE HCL 1 MG/ML IJ SOLN
0.5000 mg | INTRAMUSCULAR | Status: AC | PRN
  Administered 2022-07-24 (×2): 0.5 mg via INTRAVENOUS

## 2022-07-24 MED ORDER — ACETAMINOPHEN 160 MG/5ML PO SOLN: 325.0000 mg | ORAL | Status: DC | PRN

## 2022-07-24 MED ORDER — FENTANYL CITRATE (PF) 100 MCG/2ML IJ SOLN
INTRAMUSCULAR | Status: DC | PRN
  Administered 2022-07-24 (×3): 50 ug via INTRAVENOUS
  Administered 2022-07-24: 100 ug via INTRAVENOUS

## 2022-07-24 MED ORDER — LABETALOL HCL 5 MG/ML IV SOLN
INTRAVENOUS | Status: AC
  Filled 2022-07-24: qty 4

## 2022-07-24 MED ORDER — PROMETHAZINE HCL 25 MG/ML IJ SOLN: 6.2500 mg | INTRAMUSCULAR | Status: DC | PRN

## 2022-07-24 MED ORDER — MIDAZOLAM HCL 5 MG/5ML IJ SOLN
INTRAMUSCULAR | Status: DC | PRN
  Administered 2022-07-24: 2 mg via INTRAVENOUS

## 2022-07-24 MED ORDER — PROPOFOL 10 MG/ML IV BOLUS
INTRAVENOUS | Status: AC
  Filled 2022-07-24: qty 20

## 2022-07-24 SURGICAL SUPPLY — 42 items
APL PRP STRL LF DISP 70% ISPRP (MISCELLANEOUS) ×1
BAG COUNTER SPONGE SURGICOUNT (BAG) ×1 IMPLANT
BAG SPNG CNTER NS LX DISP (BAG) ×1
BLADE AVERAGE 25X9 (BLADE) IMPLANT
BNDG CMPR 5X2 CHSV 1 LYR STRL (GAUZE/BANDAGES/DRESSINGS)
BNDG CMPR 75X21 PLY HI ABS (MISCELLANEOUS)
BNDG CMPR 9X4 STRL LF SNTH (GAUZE/BANDAGES/DRESSINGS)
BNDG COHESIVE 2X5 TAN ST LF (GAUZE/BANDAGES/DRESSINGS) IMPLANT
BNDG COHESIVE 4X5 TAN STRL (GAUZE/BANDAGES/DRESSINGS) IMPLANT
BNDG ELASTIC 2X5.8 VLCR STR LF (GAUZE/BANDAGES/DRESSINGS) IMPLANT
BNDG ESMARK 4X9 LF (GAUZE/BANDAGES/DRESSINGS) IMPLANT
BNDG GAUZE DERMACEA FLUFF 4 (GAUZE/BANDAGES/DRESSINGS) ×2 IMPLANT
BNDG GZE 12X3 1 PLY HI ABS (GAUZE/BANDAGES/DRESSINGS) ×1
BNDG GZE DERMACEA 4 6PLY (GAUZE/BANDAGES/DRESSINGS) ×2
BNDG STRETCH GAUZE 3IN X12FT (GAUZE/BANDAGES/DRESSINGS) ×1 IMPLANT
CHLORAPREP W/TINT 26 (MISCELLANEOUS) ×1 IMPLANT
CORD BIPOLAR FORCEPS 12FT (ELECTRODE) ×1 IMPLANT
DRAPE SURG 17X23 STRL (DRAPES) ×1 IMPLANT
DRSG EMULSION OIL 3X3 NADH (GAUZE/BANDAGES/DRESSINGS) ×1 IMPLANT
ELECT REM PT RETURN 9FT ADLT (ELECTROSURGICAL) ×1
ELECTRODE REM PT RTRN 9FT ADLT (ELECTROSURGICAL) ×1 IMPLANT
GAUZE SPONGE 4X4 12PLY STRL (GAUZE/BANDAGES/DRESSINGS) ×1 IMPLANT
GAUZE STRETCH 2X75IN STRL (MISCELLANEOUS) IMPLANT
GAUZE XEROFORM 1X8 LF (GAUZE/BANDAGES/DRESSINGS) ×1 IMPLANT
GLOVE BIO SURGEON STRL SZ7.5 (GLOVE) ×1 IMPLANT
GLOVE BIOGEL PI IND STRL 8 (GLOVE) ×1 IMPLANT
GOWN STRL REUS W/ TWL XL LVL3 (GOWN DISPOSABLE) ×1 IMPLANT
GOWN STRL REUS W/TWL XL LVL3 (GOWN DISPOSABLE) ×1
KIT BASIN OR (CUSTOM PROCEDURE TRAY) ×1 IMPLANT
NDL HYPO 25X1 1.5 SAFETY (NEEDLE) IMPLANT
NEEDLE HYPO 25X1 1.5 SAFETY (NEEDLE) IMPLANT
PACK ORTHO EXTREMITY (CUSTOM PROCEDURE TRAY) ×1 IMPLANT
PAD CAST 4YDX4 CTTN HI CHSV (CAST SUPPLIES) IMPLANT
PADDING CAST ABS COTTON 4X4 ST (CAST SUPPLIES) ×1 IMPLANT
PADDING CAST COTTON 4X4 STRL (CAST SUPPLIES)
PENCIL BUTTON HOLSTER BLD 10FT (ELECTRODE) IMPLANT
SUT ETHILON 4 0 PS 2 18 (SUTURE) IMPLANT
SUT MNCRL AB 4-0 PS2 18 (SUTURE) IMPLANT
SUT VICRYL RAPIDE 4/0 PS 2 (SUTURE) IMPLANT
SYR 10ML LL (SYRINGE) IMPLANT
TOWEL GREEN STERILE FF (TOWEL DISPOSABLE) ×1 IMPLANT
UNDERPAD 30X36 HEAVY ABSORB (UNDERPADS AND DIAPERS) ×1 IMPLANT

## 2022-07-24 NOTE — Progress Notes (Signed)
  PATIENT ID: [name]  MRN: [id number]  DOB/AGE:  [birth year] / 37 y.o.  07-21-22 I&D L SF  Subjective: Pain is found soundly sleeping, requiring me to awaken her to examine her finger.  Burning pain remains, had fever overnight    Objective: Vital signs in last 24 hours: Temp:  [98.4 F (36.9 C)-101.5 F (38.6 C)] 98.4 F (36.9 C) (05/29 1300) Pulse Rate:  [86-96] 86 (05/29 1300) Resp:  [15-18] 18 (05/29 1300) BP: (100-113)/(63-71) 102/65 (05/29 1300) SpO2:  [99 %] 99 % (05/29 1300)  Intake/Output from previous day: No intake/output data recorded. Intake/Output this shift: No intake/output data recorded.  Recent Labs    07/22/22 0329 07/23/22 0231  HGB 12.2 12.1    Recent Labs    07/22/22 0329 07/23/22 0231  WBC 12.1* 8.4  RBC 4.04 4.11  HCT 36.8 37.0  PLT 394 427*    Recent Labs    07/22/22 1020 07/23/22 0231  NA 135 133*  K 3.7 4.1  CL 101 100  CO2 25 24  BUN 13 16  CREATININE 0.79 0.92  GLUCOSE 97 108*  CALCIUM 8.6* 8.7*    No results for input(s): "LABPT", "INR" in the last 72 hours.  Physical Exam: L SF still swollen, but proximal swelling is decreasing.  No TTP in palm, no swelling in palm. All tissues about the distal phalanx are black, forming eschar, and it extends palmarly to the PIP flexion crease.  The Viable tissue is more distal dorsally, near the base of the epinychial fold.  The digit is sitting nearly fully extended.  Reluctant to do AROM with digit, but waves it some, moving at MCP  WBC down to 8.4 yesterday, no new CBC today Surgical cx Gm stain = GPC in pairs; Cx abundant MRSA  Assessment/Plan: 3 Days Post-Op    I discussed the evolution of her situation with her.  She desires to minimize need for wound care, and understands the grave condition of her finger.  I presented an option to proceed to surgery tonite, excise the necrotic volar tissue, clean it all up, and likely amputate the distal and middle phalanges, using what  tissue presently remains healthy to loosely close it.  She has consented.  Will proceed tonite at some point.  Cliffton Asters Janee Morn, MD      Orthopaedic & Hand Surgery [facility] Orthopaedic & Sports Medicine [facility] [address] [place], [place]  [address] Office: [phone]  07/24/2022, 1:57 PM

## 2022-07-24 NOTE — Anesthesia Postprocedure Evaluation (Signed)
Anesthesia Post Note  Patient: [name]  Procedure(s) Performed: SMALL FINGER PARTIAL AMPUTATION (Left: Little Finger)     Patient location during evaluation: PACU Anesthesia Type: General Level of consciousness: awake and alert Pain management: pain level controlled Vital Signs Assessment: post-procedure vital signs reviewed and stable Respiratory status: spontaneous breathing, nonlabored ventilation, respiratory function stable and patient connected to nasal cannula oxygen Cardiovascular status: blood pressure returned to baseline and stable Postop Assessment: no apparent nausea or vomiting Anesthetic complications: no  No notable events documented.  Last Vitals:  Vitals:   07/24/22 2215 07/24/22 2230  BP: (!) 154/111 (!) 122/108  Pulse: 98 91  Resp: (!) 22 11  Temp:  37.2 C  SpO2: 94% 94%    Last Pain:  Vitals:   07/24/22 2230  TempSrc:   PainSc: Asleep                 Shelton Silvas

## 2022-07-24 NOTE — Anesthesia Procedure Notes (Signed)
Procedure Name: Intubation Date/Time: 07/24/2022 8:51 PM  Performed by: Edmonia Caprio, CRNAPre-anesthesia Checklist: Patient identified, Emergency Drugs available, Suction available, Patient being monitored and Timeout performed Patient Re-evaluated:Patient Re-evaluated prior to induction Oxygen Delivery Method: Circle system utilized Preoxygenation: Pre-oxygenation with 100% oxygen Induction Type: IV induction and Rapid sequence Laryngoscope Size: Miller and 2 Grade View: Grade I Tube type: Oral Tube size: 7.0 mm Number of attempts: 1 Airway Equipment and Method: Stylet Placement Confirmation: ETT inserted through vocal cords under direct vision, positive ETCO2 and breath sounds checked- equal and bilateral Secured at: 21 cm Tube secured with: Tape Dental Injury: Teeth and Oropharynx as per pre-operative assessment

## 2022-07-24 NOTE — Op Note (Addendum)
07/20/2022 - 07/24/2022  8:32 PM  PATIENT:  [name]  37 y.o. female  PRE-OPERATIVE DIAGNOSIS: Infectious necrosis of distal portion of left small finger  POST-OPERATIVE DIAGNOSIS:  Same  PROCEDURE:  1. Drainage and irrigation of L SF flexor tendon sheath 2. 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 Janee Morn, MD  PHYSICIAN ASSISTANT: None  ANESTHESIA: General  SPECIMENS: none  DRAINS:   None  EBL:  less than 10mL  PREOPERATIVE INDICATIONS:  [name] is a  37 y.o. 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  The risks benefits and alternatives were discussed with the patient preoperatively including but not limited to the risks of infection, bleeding, nerve injury, cardiopulmonary complications, the need for revision surgery, among others, and the patient verbalized understanding and consented to proceed.  OPERATIVE IMPLANTS: None  OPERATIVE PROCEDURE: As the patient is already on standing antibiotics, the patient was escorted to the operative theatre and placed in a supine position.  General anesthesia was administered.  A surgical "time-out" was performed during which the planned procedure, proposed operative site, and the correct patient identity were compared to the operative consent and agreement confirmed by the circulating nurse according to current facility policy.  Following application of a tourniquet to the operative extremity, the exposed skin was prepped with Chloraprep and draped in the usual sterile fashion.  The limb was exsanguinated with gravity and the tourniquet inflated to approximately higher than systolic BP.  The necrotic volar distal skin was excised with scissors and forceps.  Under this there was nice red granulation tissue with minimal scattered areas of necrotic tissue.  The distal  phalanx was exposed and somewhat desiccated.  The distal phalanx was then excised sharply with a knife through the DIPJ.  Dorsally, the nail complex was excised, and because of the volar wound, this really left medial and lateral tissue extensions similar to Kutler flaps.  Attention was shifted to the palmar wound, which was spread open and extended slightly distally.  Spreading dissection was carried down to the flexor tendon sheath and the initial 2 to 3 mm of the A1 pulley was divided.  There was no purulent fluid encountered.  An Angiocath was inserted into the flexor sheath and irrigant passed which exited the sheath distally.  It was clear.  After 2 to 3 syringes full of irrigant in this manner, the catheter was placed in the subcutaneous plane and again from proximal towards distal irrigated.  It emerged clear as well.  This effectively irrigated both the subcutaneous plane as well as the flexor tendon sheath.  Attention was shifted back to the completion of the amputation.  The distal aspect of P2 was excised and shaped with the rongeurs to remove the bulbous condyles.  The tourniquet was released, the wound was again copiously irrigated and the digit was closed loosely with 4-0 Prolene suture.  Distally, the 2 Kutler flap equivalent were inset as VY flaps to provide for nearly full closure.  The proximal incision was reapproximated with 4-0 Prolene interrupted suture as well.  The flaps all appeared viable with punctate bleeding at the margins prior to insetting.  Bacitracin was applied liberally on the exterior of the digit followed by Xeroform, 4 x 4 gauze and light Kerlix.  She was taken to recovery room in stable condition, breathing spontaneously.  DISPOSITION: She will be  returned to the floor for continued multidisciplinary care for this MRSA infection of her left small finger.

## 2022-07-24 NOTE — Progress Notes (Signed)
  Progress Note   Patient: [name] ZOX:[id number] DOB: 01-Apr-1985 DOA: 07/20/2022     4 DOS: the patient was seen and examined on 07/24/2022   Brief hospital course: [name] is a 37 y.o. female with a history of polysubstance abuse.  Patient presented secondary to pain and swelling of her left finger after insect bite and was found to have concern for acute infection.  Evidence of sepsis on admission as well.  Empiric antibiotics started and blood cultures obtained.  Orthopedic surgery was consulted and performed excision of skin/subcutaneous tissue with proximals-distal irrigation on 5/26.  Wound cultures pending.  Assessment and Plan: Finger infection Left fifth digit.  Blood cultures obtained.  Empiric vancomycin and Zosyn started on admission.  Orthopedic surgery consulted on admission and performed an excision of skin/subcutaneous tissue with proximal-distal irrigation on 5/26.  Recommendations from orthopedic surgery for continued IV antibiotics and to follow-up wound cultures. Wound culture significant for methicillin resistant staphylococcus aureus. -Pt is continued on vanc per ID recs -Per Hand Surgeon, plan for OR tonight   Sepsis Present on admission.  Source is her finger infection and secondary to MRSA seen on wound culture.  Patient started empirically on antibiotics on admission with blood cultures obtained.  Blood cultures so far no growth to date.  -No further leukocytosis   Elevated AST/ALT Unclear etiology. Possibly related to acute infection. Trending down.   Thrombocytosis Mildly elevated platelets of 406,000 on admission.  Likely reactive and secondary to acute infection.     Polysubstance abuse Patient has a history of tobacco cocaine, marijuana use.  Current toxicology screen was significant for positive cocaine and THC.  Toxicology screen also positive for opiates, however patient received morphine prior to toxicology screen. -Will need  cessation   Subjective: Complaining of continued pain in finger  Physical Exam: Vitals:   07/23/22 1348 07/23/22 1926 07/24/22 0807 07/24/22 1300  BP: 105/68 113/71 100/63 102/65  Pulse: 79 96 91 86  Resp: 16 15 17 18   Temp: 98.5 F (36.9 C) 98.9 F (37.2 C) (!) 101.5 F (38.6 C) 98.4 F (36.9 C)  TempSrc: Oral  Oral Oral  SpO2: 98% 99% 99% 99%  Weight:      Height:       General exam: Awake, laying in bed, in nad Respiratory system: Normal respiratory effort, no wheezing Cardiovascular system: regular rate, s1, s2 Gastrointestinal system: Soft, nondistended, positive BS Central nervous system: CN2-12 grossly intact, strength intact Extremities: Perfused, L fifth digit swollen with overlying scab Skin: Normal skin turgor, no notable skin lesions seen Psychiatry: Mood normal // no visual hallucinations   Data Reviewed:  There are no new results to review at this time.  Family Communication: Pt in room, family not at bedside  Disposition: Status is: Inpatient Remains inpatient appropriate because: Severity of illness  Planned Discharge Destination: Home    Author: Rickey Barbara, MD 07/24/2022 4:16 PM  For on call review [email or web address].

## 2022-07-24 NOTE — Progress Notes (Signed)
[facility]  Date of Admission:  07/20/2022     Total days of antibiotics 5         ASSESSMENT:  Ms. Hodson is POD #3 from left 5th finger debridement for MRSA infection with fever of 101.5 F. Suspect fever may be related to source control and burden of infection. Awaiting further follow up and evaluation by Dr. Janee Morn. Blood cultures from 5/25 have been without growth with new blood cultures obtained earlier today.  Continue current dose of vancomycin and wound care per Dr. Janee Morn with remaining medical and supportive care per Internal Medicine.   PLAN:  Continue current dose of vancomycin. Continue wound care per Dr. Janee Morn and await further evaluation for source control. Monitor blood cultures for presence of bacteremia Therapeutic drug monitoring of renal function and vancomycin levels.  Remaining medical and supportive care per Internal Medicine.    I have personally spent 23 minutes involved in face-to-face and non-face-to-face activities for this patient on the day of the visit. Professional time spent includes the following activities: Preparing to see the patient (review of tests), Obtaining and/or reviewing separately obtained history (admission/discharge record), Performing a medically appropriate examination and/or evaluation , Ordering medications/tests/procedures, referring and communicating with other health care professionals, Documenting clinical information in the EMR, Independently interpreting results (not separately reported), Communicating results to the patient/family/caregiver, Counseling and educating the patient/family/caregiver and Care coordination (not separately reported).    Principal Problem:   Finger infection Active Problems:   Thrombocytosis   Leukocytosis   Polysubstance abuse (HCC)   MRSA infection    acetaminophen  650 mg Oral Q6H   meloxicam  15 mg Oral Daily   sodium chloride flush  3 mL Intravenous Q12H     SUBJECTIVE:  Febrile this morning with max temperature of 101.5 F. No acute events overnight. Pain adequately controlled.   No Known Allergies   Review of Systems: Review of Systems  Constitutional:  Positive for fever. Negative for chills and weight loss.  Respiratory:  Negative for cough, shortness of breath and wheezing.   Cardiovascular:  Negative for chest pain and leg swelling.  Gastrointestinal:  Negative for abdominal pain, constipation, diarrhea, nausea and vomiting.  Skin:  Negative for rash.      OBJECTIVE: Vitals:   07/22/22 2014 07/23/22 1348 07/23/22 1926 07/24/22 0807  BP: (!) 111/53 105/68 113/71 100/63  Pulse: 98 79 96 91  Resp: 17 16 15 17   Temp:  98.5 F (36.9 C) 98.9 F (37.2 C) (!) 101.5 F (38.6 C)  TempSrc:  Oral  Oral  SpO2: (!) 87% 98% 99% 99%  Weight:      Height:       Body mass index is 25.23 kg/m.  Physical Exam Constitutional:      General: She is not in acute distress.    Appearance: She is well-developed.  Cardiovascular:     Rate and Rhythm: Normal rate and regular rhythm.     Heart sounds: Normal heart sounds.  Pulmonary:     Effort: Pulmonary effort is normal.     Breath sounds: Normal breath sounds.  Musculoskeletal:     Comments: Left 5th finger with surgical site appearing approximated. Distal finger has small amount of bloody drainage on it. Overall site is red, edematous and warm to the touch.   Skin:    General: Skin is warm and dry.  Neurological:     Mental Status: She is alert.  Psychiatric:  Mood and Affect: Mood normal.     Lab Results Lab Results  Component Value Date   WBC 8.4 07/23/2022   HGB 12.1 07/23/2022   HCT 37.0 07/23/2022   MCV 90.0 07/23/2022   PLT 427 (H) 07/23/2022    Lab Results  Component Value Date   CREATININE 0.92 07/23/2022   BUN 16 07/23/2022   NA 133 (L) 07/23/2022   K 4.1 07/23/2022   CL 100 07/23/2022   CO2 24 07/23/2022    Lab Results  Component Value Date    ALT 78 (H) 07/23/2022   AST 49 (H) 07/23/2022   ALKPHOS 93 07/23/2022   BILITOT 0.4 07/23/2022     Microbiology: Recent Results (from the past 240 hour(s))  Blood culture (routine x 2)     Status: None (Preliminary result)   Collection Time: 07/20/22  8:54 AM   Specimen: BLOOD  Result Value Ref Range Status   Specimen Description BLOOD SITE NOT SPECIFIED  Final   Special Requests   Final    BOTTLES DRAWN AEROBIC AND ANAEROBIC Blood Culture results may not be optimal due to an inadequate volume of blood received in culture bottles   Culture   Final    NO GROWTH 4 DAYS Performed at [facility], [address], [place], [place] [address]    Report Status PENDING  Incomplete  Blood culture (routine x 2)     Status: None (Preliminary result)   Collection Time: 07/20/22  9:18 AM   Specimen: BLOOD  Result Value Ref Range Status   Specimen Description BLOOD SITE NOT SPECIFIED  Final   Special Requests   Final    BOTTLES DRAWN AEROBIC AND ANAEROBIC Blood Culture adequate volume   Culture   Final    NO GROWTH 4 DAYS Performed at [facility], [address], [place], [place] [address]    Report Status PENDING  Incomplete  Aerobic/Anaerobic Culture w Gram Stain (surgical/deep wound)     Status: None (Preliminary result)   Collection Time: 07/21/22 10:13 AM   Specimen: Finger, Left; Abscess  Result Value Ref Range Status   Specimen Description ABSCESS  Final   Special Requests LEFT SMALL FINGER, ZOSYN, VANCOMYCIN  Final   Gram Stain   Final    RARE WBC PRESENT, PREDOMINANTLY MONONUCLEAR FEW GRAM POSITIVE COCCI IN PAIRS IN CLUSTERS Performed at [facility], [address] [place], [place] [address]    Culture   Final    ABUNDANT METHICILLIN RESISTANT STAPHYLOCOCCUS AUREUS NO ANAEROBES ISOLATED; CULTURE IN PROGRESS FOR 5 DAYS    Report Status PENDING  Incomplete   Organism ID, Bacteria METHICILLIN RESISTANT STAPHYLOCOCCUS AUREUS  Final      Susceptibility    Methicillin resistant staphylococcus aureus - MIC*    CIPROFLOXACIN >=8 RESISTANT Resistant     ERYTHROMYCIN >=8 RESISTANT Resistant     GENTAMICIN <=0.5 SENSITIVE Sensitive     OXACILLIN >=4 RESISTANT Resistant     TETRACYCLINE <=1 SENSITIVE Sensitive     VANCOMYCIN <=0.5 SENSITIVE Sensitive     TRIMETH/SULFA >=320 RESISTANT Resistant     CLINDAMYCIN <=0.25 SENSITIVE Sensitive     RIFAMPIN <=0.5 SENSITIVE Sensitive     Inducible Clindamycin NEGATIVE Sensitive     LINEZOLID 2 SENSITIVE Sensitive     * ABUNDANT METHICILLIN RESISTANT STAPHYLOCOCCUS AUREUS     Marcos Eke, NP [facility]  07/24/2022  11:30 AM

## 2022-07-24 NOTE — Progress Notes (Signed)
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 Underwent L small finger excision of skin and subcutaneous tissue with prox - distal irrigation due infection. PMH: L long finger partial amputation   OT comments  Pt continuing to progress with L hand functionality in OT sessions. Reinforced education pt on isolated PIP/DIP flex/ext, handout provided. Pt completed full composite fisting with AAROM assist from RUE to promote full DIP and PIP flexion of L little finger. With AAROM pt able to pull finger within 1 inch of palm, reinforced opposition pt now able to achieve full opposition with effort. Pt continues to be limited by pain in digit with ROM. OT to continue to progress pt as able. DC plans remain appropriate for outpatient OT at this time.    Recommendations for follow up therapy are one component of a multi-disciplinary discharge planning process, led by the attending physician.  Recommendations may be updated based on patient status, additional functional criteria and insurance authorization.    Assistance Recommended at Discharge PRN  Patient can return home with the following      Equipment Recommendations  None recommended by OT    Recommendations for Other Services      Precautions / Restrictions Precautions Precautions: Other (comment) (L finger open wound) Precaution Comments: L little finger wound       Mobility Bed Mobility Overal bed mobility: Independent                  Transfers                         Balance                                           ADL either performed or assessed with clinical judgement   ADL                                         General ADL Comments: Focused  session on digit/hand AROM    Extremity/Trunk Assessment Upper Extremity Assessment LUE Deficits / Details: L little finger still presenting with swelling, decreased composite flexion, PIP able to achieve ~ 80 deg flex, DIP tender to touch. lateral aspect of little finger black. Able to oppose with effort            Vision       Perception     Praxis      Cognition Arousal/Alertness: Awake/alert Behavior During Therapy: WFL for tasks assessed/performed Overall Cognitive Status: Within Functional Limits for tasks assessed                                          Exercises Hand Exercises Digit Composite Flexion:  (10 sec holds) Opposition: AROM, 10 reps, Left Other Exercises Other Exercises: PIP and DIP flexion/extenstion with blocking x10 reps with 10 sec holds    Shoulder Instructions       General Comments increase in pain with digit ROM, RN notified  of pt need for pain meds    Pertinent Vitals/ Pain       Pain Assessment Pain Assessment: Faces Faces Pain Scale: Hurts even more Pain Location: L DIP with ROM Pain Descriptors / Indicators: Discomfort, Grimacing, Guarding Pain Intervention(s): Limited activity within patient's tolerance, Monitored during session, Patient requesting pain meds-RN notified  Home Living                                          Prior Functioning/Environment              Frequency  Min 4X/week        Progress Toward Goals  OT Goals(current goals can now be found in the care plan section)  Progress towards OT goals: Progressing toward goals  Acute Rehab OT Goals Patient Stated Goal: to move her hand better OT Goal Formulation: With patient Time For Goal Achievement: 08/05/22 Potential to Achieve Goals: Good  Plan Discharge plan remains appropriate    Co-evaluation                 AM-PAC OT "6 Clicks" Daily Activity     Outcome Measure   Help from another person eating  meals?: A Little Help from another person taking care of personal grooming?: A Little Help from another person toileting, which includes using toliet, bedpan, or urinal?: None Help from another person bathing (including washing, rinsing, drying)?: A Little Help from another person to put on and taking off regular upper body clothing?: A Little Help from another person to put on and taking off regular lower body clothing?: A Little 6 Click Score: 19    End of Session    OT Visit Diagnosis: Pain;Muscle weakness (generalized) (M62.81) Pain - Right/Left: Left Pain - part of body: Hand   Activity Tolerance Patient tolerated treatment well   Patient Left in bed;with call bell/phone within reach   Nurse Communication Other (comment) (L hand progression)        Time: 2130-8657 OT Time Calculation (min): 25 min  Charges: OT General Charges $OT Visit: 1 Visit OT Treatments $Therapeutic Exercise: 23-37 mins  07/24/2022  AB, OTR/L  Acute Rehabilitation Services  Office: [phone]   Tristan Schroeder 07/24/2022, 1:28 PM

## 2022-07-24 NOTE — Anesthesia Preprocedure Evaluation (Signed)
Anesthesia Evaluation  Patient identified by MRN, date of birth, ID band Patient awake    Reviewed: Allergy & Precautions, NPO status , Patient's Chart, lab work & pertinent test results  Airway Mallampati: II  TM Distance: >3 FB Neck ROM: Full    Dental  (+) Teeth Intact, Dental Advisory Given   Pulmonary Current Smoker   breath sounds clear to auscultation       Cardiovascular negative cardio ROS  Rhythm:Regular Rate:Normal     Neuro/Psych negative neurological ROS  negative psych ROS   GI/Hepatic negative GI ROS, Neg liver ROS,,,  Endo/Other  negative endocrine ROS    Renal/GU negative Renal ROS     Musculoskeletal   Abdominal   Peds  Hematology negative hematology ROS (+)   Anesthesia Other Findings   Reproductive/Obstetrics                             Anesthesia Physical Anesthesia Plan  ASA: 2  Anesthesia Plan: General   Post-op Pain Management: Toradol IV (intra-op)* and Ofirmev IV (intra-op)*   Induction: Intravenous, Cricoid pressure planned and Rapid sequence  PONV Risk Score and Plan: 3 and Ondansetron, Dexamethasone and Midazolam  Airway Management Planned: Oral ETT  Additional Equipment: None  Intra-op Plan:   Post-operative Plan: Extubation in OR  Informed Consent: I have reviewed the patients History and Physical, chart, labs and discussed the procedure including the risks, benefits and alternatives for the proposed anesthesia with the patient or authorized representative who has indicated his/her understanding and acceptance.     Dental advisory given  Plan Discussed with: CRNA  Anesthesia Plan Comments:        Anesthesia Quick Evaluation

## 2022-07-24 NOTE — Transfer of Care (Signed)
Immediate Anesthesia Transfer of Care Note  Patient: [name]  Procedure(s) Performed: SMALL FINGER PARTIAL AMPUTATION (Left: Little Finger)  Patient Location: PACU  Anesthesia Type:General  Level of Consciousness: awake and alert   Airway & Oxygen Therapy: Patient Spontanous Breathing  Post-op Assessment: Report given to RN and Post -op Vital signs reviewed and stable  Post vital signs: Reviewed and stable  Last Vitals:  Vitals Value Taken Time  BP    Temp    Pulse 118 07/24/22 2140  Resp 15 07/24/22 2139  SpO2 91 % 07/24/22 2140  Vitals shown include unvalidated device data.  Last Pain:  Vitals:   07/24/22 1300  TempSrc: Oral  PainSc:       Patients Stated Pain Goal: 3 (07/23/22 1040)  Complications: No notable events documented.

## 2022-07-25 ENCOUNTER — Encounter (HOSPITAL_COMMUNITY): Payer: Self-pay | Admitting: Orthopedic Surgery

## 2022-07-25 LAB — COMPREHENSIVE METABOLIC PANEL
ALT: 95 U/L — ABNORMAL HIGH (ref 0–44)
AST: 51 U/L — ABNORMAL HIGH (ref 15–41)
Albumin: 3.3 g/dL — ABNORMAL LOW (ref 3.5–5.0)
Alkaline Phosphatase: 101 U/L (ref 38–126)
Anion gap: 11 (ref 5–15)
BUN: 15 mg/dL (ref 6–20)
CO2: 25 mmol/L (ref 22–32)
Calcium: 9.3 mg/dL (ref 8.9–10.3)
Chloride: 99 mmol/L (ref 98–111)
Creatinine, Ser: 0.8 mg/dL (ref 0.44–1.00)
GFR, Estimated: >60 mL/min (ref >60)
Glucose, Bld: 135 mg/dL — ABNORMAL HIGH (ref 70–99)
Potassium: 3.9 mmol/L (ref 3.5–5.1)
Sodium: 135 mmol/L (ref 135–145)
Total Bilirubin: 0.5 mg/dL (ref 0.3–1.2)
Total Protein: 7.6 g/dL (ref 6.5–8.1)

## 2022-07-25 LAB — CBC
HCT: 39.6 % (ref 36.0–46.0)
Hemoglobin: 12.9 g/dL (ref 12.0–15.0)
MCH: 29.5 pg (ref 26.0–34.0)
MCHC: 32.6 g/dL (ref 30.0–36.0)
MCV: 90.6 fL (ref 80.0–100.0)
Platelets: 571 10*3/uL — ABNORMAL HIGH (ref 150–400)
RBC: 4.37 MIL/uL (ref 3.87–5.11)
RDW: 13.8 % (ref 11.5–15.5)
WBC: 19.4 10*3/uL — ABNORMAL HIGH (ref 4.0–10.5)
nRBC: 0 % (ref 0.0–0.2)

## 2022-07-25 LAB — CULTURE, BLOOD (ROUTINE X 2)
Culture: NO GROWTH
Culture: NO GROWTH
Special Requests: ADEQUATE

## 2022-07-25 LAB — VANCOMYCIN, TROUGH: Vancomycin Tr: <4 ug/mL — ABNORMAL LOW (ref 15–20)

## 2022-07-25 MED ORDER — VANCOMYCIN HCL 750 MG/150ML IV SOLN: 750.0000 mg | Freq: Two times a day (BID) | INTRAVENOUS | Status: DC

## 2022-07-25 MED ORDER — PHENOL 1.4 % MT LIQD: 1.0000 | OROMUCOSAL | Status: DC | PRN

## 2022-07-25 MED ORDER — LINEZOLID 600 MG PO TABS
600.0000 mg | ORAL_TABLET | Freq: Two times a day (BID) | ORAL | Status: DC
  Administered 2022-07-25 – 2022-07-27 (×5): 600 mg via ORAL
  Filled 2022-07-25 (×6): qty 1

## 2022-07-25 NOTE — Progress Notes (Signed)
CSW spoke with pt regarding substance use.  Pt reports she uses cocaine every other day, ETOH every other day, marijuana about once per week.  Pt reports she has taken prescribed pain meds for her finger but denies abuse of opiates.  Pt reports she has not been to previous substance abuse treatment.  CSW provided contact information for both outpt and residential substance use treatment.  Pt reports she does not currently have an ID, discussed that she will likely need that, at least for residential treatment.  Pt accepted resource list and verbalized understanding. Daleen Squibb, MSW, LCSW 5/30/202412:11 PM

## 2022-07-25 NOTE — Progress Notes (Signed)
Pharmacy Antibiotic Note- follow-up  [name] is a 37 y.o. female admitted on 07/20/2022 with  flexor tenosynovitis .  Pharmacy has been consulted for vancomycin dosing.   Dose assessment: 1000 mg iv q24h Dose: 5/29 0754 Vp: 26 (5/29 0935) Vt: <4 (5/30 0943) Calc AUC 307.9 mcg*hr/mL T 1/2: 8.8 hours Vd: 41.3 L  Plan: Change vancomycin 750 mg IV q12h (eAUC 307.9 mcg*hr/mL, Scr 0.8) Monitor clinical progress, c/s, renal function vancomycin levels as indicated Continue vanco while in-patient Transition to doxycycline oral x 4 weeks  from surgery (tentative end date: 08/21/2022)  Height: 4' 11.02" (149.9 cm) Weight: 56.7 kg (125 lb) IBW/kg (Calculated) : 43.24  Temp (24hrs), Avg:98.4 F (36.9 C), Min:97.4 F (36.3 C), Max:99 F (37.2 C)  Recent Labs  Lab 07/20/22 0810 07/20/22 1000 07/21/22 0247 07/22/22 0329 07/22/22 1020 07/23/22 0231 07/24/22 0935 07/25/22 0130 07/25/22 0943  WBC  --  13.2* 14.5* 12.1*  --  8.4  --  19.4*  --   CREATININE 0.83  --  1.08*  --  0.79 0.92  --  0.80  --   LATICACIDVEN 0.8 0.7  --   --   --   --   --   --   --   VANCOTROUGH  --   --   --   --   --   --   --   --  <4*  VANCOPEAK  --   --   --   --   --   --  26*  --   --      Estimated Creatinine Clearance: 74.6 mL/min (by C-G formula based on SCr of 0.8 mg/dL).    No Known Allergies  Antimicrobials this admission: 5/25 vancomycin >>  5/25 zosyn >> 5/28  Dose adjustments this admission:   Microbiology results: 5/25 [facility] x2: ngtd 5/25 BCx x2: ngtd  5/26 surgical wound: MRSA 5/29 BCX: [id number]  Greta Doom BS, PharmD, BCPS Clinical Pharmacist 07/25/2022 10:53 AM  Contact: [phone] after 3 PM  "Be curious, not judgmental..." -Debbora Dus

## 2022-07-25 NOTE — TOC CM/SW Note (Addendum)
Transition of Care [facility]) - Inpatient Brief Assessment   Patient Details  Name: [name] MRN: [id number] Date of Birth: Jul 30, 1985  Transition of Care [facility]) CM/SW Contact:    Epifanio Lesches, RN Phone Number: 07/25/2022, 3:48 PM   Clinical Narrative: Pt s/p L 5th finger debridement,5/26. Cultures pending. ID following. Hx polysubstance abuse.  Pt states homeless x 2 months, has lived with a friend. States mom lives in [place] however unable to live with mom 2/2 doesn't get alone with mom's boyfriend. TOC team to provide shelter resources prior to d/c, pt interested. F.C referral made to for medicaid screening, pt without income, jobless.  Pt without PCP, TOC team to f/u closer to d/c date.  TOC team following....  Transition of Care Asessment: Insurance and Status: Selfpay (no insurance, jobless, no income) Patient has primary care physician: No Home environment has been reviewed: homeless x 2 months Prior level of function:: PTA independent  with ADL's   Social Determinants of Health Reivew: SDOH reviewed needs interventions Readmission risk has been reviewed: No Transition of care needs: transition of care needs identified, TOC will continue to follow

## 2022-07-25 NOTE — Progress Notes (Signed)
  PATIENT ID: [name]  MRN: [id number]  DOB/AGE:  Oct 22, 1985 / 37 y.o.  07-21-22 I&D L SF 07-24-22 L SF partial amp  Subjective: Pain is found soundly sleeping, requiring me to awaken her to examine her finger.  Reports finger feels better, less pain.  Postop dressing remains despite postop order to remove/cleanse/re-dress    Objective: Vital signs in last 24 hours: Temp:  [97.4 F (36.3 C)-99 F (37.2 C)] 98.5 F (36.9 C) (05/30 1451) Pulse Rate:  [73-118] 92 (05/30 1451) Resp:  [11-22] 18 (05/30 1451) BP: (104-167)/(61-127) 117/72 (05/30 1451) SpO2:  [91 %-99 %] 97 % (05/30 1451)  Intake/Output from previous day: 05/29 0701 - 05/30 0700 In: -  Out: 5 [Blood:5] Intake/Output this shift: No intake/output data recorded.  Recent Labs    07/23/22 0231 07/25/22 0130  HGB 12.1 12.9    Recent Labs    07/23/22 0231 07/25/22 0130  WBC 8.4 19.4*  RBC 4.11 4.37  HCT 37.0 39.6  PLT 427* 571*    Recent Labs    07/23/22 0231 07/25/22 0130  NA 133* 135  K 4.1 3.9  CL 100 99  CO2 24 25  BUN 16 15  CREATININE 0.92 0.80  GLUCOSE 108* 135*  CALCIUM 8.7* 9.3    No results for input(s): "LABPT", "INR" in the last 72 hours.  Physical Exam: L SF dressing intact Cx abundant MRSA  Assessment/Plan: S/p L SF partial amp  -Continue wound care (needs to re-start, not sure why hasn't been done yet) -Antibiotic/infection plan per ID and hospitalist service. -Continue OT  I will plan to re-eval tomorrow.    Will need outpatient OT upon d/c, order has been written into d/c orders    Madison Hua A. Janee Morn, MD      Orthopaedic & Hand Surgery [facility] - Inpatient Orthopaedic & Sports Medicine [facility] [address] [place], [place]  [address] Office: [phone]  07/25/2022, 4:52 PM

## 2022-07-25 NOTE — Progress Notes (Signed)
  Progress Note   Patient: [name]:[id number] DOB: 1985/12/15 DOA: 07/20/2022     5 DOS: the patient was seen and examined on 07/25/2022   Brief hospital course: [name] is a 37 y.o. female with a history of polysubstance abuse.  Patient presented secondary to pain and swelling of her left finger after insect bite and was found to have concern for acute infection.  Evidence of sepsis on admission as well.  Empiric antibiotics started and blood cultures obtained.  Orthopedic surgery was consulted and performed excision of skin/subcutaneous tissue with proximals-distal irrigation on 5/26.  Wound cultures pending.  Assessment and Plan: Finger infection Left fifth digit.  Blood cultures obtained.  Empiric vancomycin and Zosyn started on admission.  Orthopedic surgery consulted on admission and performed an excision of skin/subcutaneous tissue with proximal-distal irrigation on 5/26.   Wound culture significant for methicillin resistant staphylococcus aureus. -Pt had been continued on vanc per ID recs -Per Hand Surgeon following. 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  Source is her finger infection and secondary to MRSA seen on wound culture.  Patient started empirically on antibiotics on admission with blood cultures obtained.  Blood cultures so far no growth to date.  -Now afebrile   Elevated AST/ALT Unclear etiology. Possibly related to acute infection. Overall appears stable   Thrombocytosis Mildly elevated platelets of 406,000 on admission.  Likely reactive and secondary to acute infection and surgery    Polysubstance abuse Patient has a history of tobacco cocaine, marijuana use.  Current toxicology screen was significant for positive cocaine and THC.  Toxicology screen also positive  for opiates, however patient received morphine prior to toxicology screen. -Will need cessation   Subjective: Without complaints this AM  Physical Exam: Vitals:   07/24/22 2347 07/25/22 0600 07/25/22 0852 07/25/22 1451  BP: (!) 129/90 126/79 104/61 117/72  Pulse: 85 84 84 92  Resp:  16 17 18   Temp:  98.3 F (36.8 C) 98.5 F (36.9 C) 98.5 F (36.9 C)  TempSrc:  Oral Oral Oral  SpO2:  99% 99% 97%  Weight:      Height:       General exam: Conversant, in no acute distress Respiratory system: normal chest rise, clear, no audible wheezing Cardiovascular system: regular rhythm, s1-s2 Gastrointestinal system: Nondistended, nontender, pos BS Central nervous system: No seizures, no tremors Extremities: No cyanosis, no joint deformities, post-op dressings in place over L digits Skin: No rashes, no pallor Psychiatry: Affect normal // no auditory hallucinations   Data Reviewed:  Labs reviewed: Na 135, k 3.9, Cr 0.80, WBC 19.4, Hgb 12.9  Family Communication: Pt in room, family not at bedside  Disposition: Status is: Inpatient Remains inpatient appropriate because: Severity of illness  Planned Discharge Destination: Home    Author: Rickey Barbara, MD 07/25/2022 4:06 PM  For on call review [email or web address].

## 2022-07-25 NOTE — Progress Notes (Signed)
[facility]  Date of Admission:  07/20/2022     Total days of antibiotics 6         ASSESSMENT:  [name] is POD #1 from left 5th finger partial amputation. Reviewed plan of care to include continued vancomycin while remaining inpatient and then transition to PO doxycycline at discharge for prolonged course of 4 weeks from the date of surgery with tentative end date 6/26. Will arrange follow up in the ID clinic. Monitor fever curve and leukocytosis. Post-operative wound care per Dr. Janee Morn with remaining medical and supportive care per Internal Medicine.   PLAN:  Continue vancomycin while in-patient. Plan to transition to oral doxycycline for 4 weeks from surgery.  Post-operative wound care per Dr. Janee Morn. Follow up in ID clinic Remaining medical and supportive care per Internal Medicine.   I have personally spent 24 minutes involved in face-to-face and non-face-to-face activities for this patient on the day of the visit. Professional time spent includes the following activities: Preparing to see the patient (review of tests), Obtaining and/or reviewing separately obtained history (admission/discharge record), Performing a medically appropriate examination and/or evaluation , Ordering medications/tests/procedures, referring and communicating with other health care professionals, Documenting clinical information in the EMR, Independently interpreting results (not separately reported), Communicating results to the patient/family/caregiver, Counseling and educating the patient/family/caregiver and Care coordination (not separately reported).    Principal Problem:   Finger infection Active Problems:   Thrombocytosis   Leukocytosis   Polysubstance abuse (HCC)   MRSA infection    acetaminophen  650 mg Oral Q6H   bacitracin  1 Application Topical Daily   labetalol       meloxicam  15 mg Oral Daily   sodium chloride flush  3 mL Intravenous Q12H     SUBJECTIVE:  Afebrile overnight with no acute events. POD #1 and doing okay. Tolerating antibiotics with no adverse side effects.   No Known Allergies   Review of Systems: Review of Systems  Constitutional:  Negative for chills, fever and weight loss.  Respiratory:  Negative for cough, shortness of breath and wheezing.   Cardiovascular:  Negative for chest pain and leg swelling.  Gastrointestinal:  Negative for abdominal pain, constipation, diarrhea, nausea and vomiting.  Skin:  Negative for rash.      OBJECTIVE: Vitals:   07/24/22 2230 07/24/22 2347 07/25/22 0600 07/25/22 0852  BP: (!) 122/108 (!) 129/90 126/79 104/61  Pulse: 91 85 84 84  Resp: 11  16 17   Temp: 99 F (37.2 C)  98.3 F (36.8 C) 98.5 F (36.9 C)  TempSrc:   Oral Oral  SpO2: 94%  99% 99%  Weight:      Height:       Body mass index is 25.23 kg/m.  Physical Exam Constitutional:      General: She is not in acute distress.    Appearance: She is well-developed.  Cardiovascular:     Rate and Rhythm: Normal rate and regular rhythm.     Heart sounds: Normal heart sounds.  Pulmonary:     Effort: Pulmonary effort is normal.     Breath sounds: Normal breath sounds.  Musculoskeletal:     Comments: Post surgical dressing in place.   Skin:    General: Skin is warm and dry.  Neurological:     Mental Status: She is alert.     Lab Results Lab Results  Component Value Date   WBC 19.4 (H) 07/25/2022   HGB 12.9 07/25/2022  HCT 39.6 07/25/2022   MCV 90.6 07/25/2022   PLT 571 (H) 07/25/2022    Lab Results  Component Value Date   CREATININE 0.80 07/25/2022   BUN 15 07/25/2022   NA 135 07/25/2022   K 3.9 07/25/2022   CL 99 07/25/2022   CO2 25 07/25/2022    Lab Results  Component Value Date   ALT 95 (H) 07/25/2022   AST 51 (H) 07/25/2022   ALKPHOS 101 07/25/2022   BILITOT 0.5 07/25/2022     Microbiology: Recent Results (from the past 240 hour(s))  Blood culture (routine x 2)     Status:  None   Collection Time: 07/20/22  8:54 AM   Specimen: BLOOD  Result Value Ref Range Status   Specimen Description BLOOD SITE NOT SPECIFIED  Final   Special Requests   Final    BOTTLES DRAWN AEROBIC AND ANAEROBIC Blood Culture results may not be optimal due to an inadequate volume of blood received in culture bottles   Culture   Final    NO GROWTH 5 DAYS Performed at [facility], [address], [place], [place] [address]    Report Status 07/25/2022 FINAL  Final  Blood culture (routine x 2)     Status: None   Collection Time: 07/20/22  9:18 AM   Specimen: BLOOD  Result Value Ref Range Status   Specimen Description BLOOD SITE NOT SPECIFIED  Final   Special Requests   Final    BOTTLES DRAWN AEROBIC AND ANAEROBIC Blood Culture adequate volume   Culture   Final    NO GROWTH 5 DAYS Performed at [facility], [address] [place], [place] [address]    Report Status 07/25/2022 FINAL  Final  Aerobic/Anaerobic Culture w Gram Stain (surgical/deep wound)     Status: None (Preliminary result)   Collection Time: 07/21/22 10:13 AM   Specimen: Finger, Left; Abscess  Result Value Ref Range Status   Specimen Description ABSCESS  Final   Special Requests LEFT SMALL FINGER, ZOSYN, VANCOMYCIN  Final   Gram Stain   Final    RARE WBC PRESENT, PREDOMINANTLY MONONUCLEAR FEW GRAM POSITIVE COCCI IN PAIRS IN CLUSTERS Performed at [facility], [address], [place], [place] [address]    Culture   Final    ABUNDANT METHICILLIN RESISTANT STAPHYLOCOCCUS AUREUS NO ANAEROBES ISOLATED; CULTURE IN PROGRESS FOR 5 DAYS    Report Status PENDING  Incomplete   Organism ID, Bacteria METHICILLIN RESISTANT STAPHYLOCOCCUS AUREUS  Final      Susceptibility   Methicillin resistant staphylococcus aureus - MIC*    CIPROFLOXACIN >=8 RESISTANT Resistant     ERYTHROMYCIN >=8 RESISTANT Resistant     GENTAMICIN <=0.5 SENSITIVE Sensitive     OXACILLIN >=4 RESISTANT Resistant     TETRACYCLINE <=1  SENSITIVE Sensitive     VANCOMYCIN <=0.5 SENSITIVE Sensitive     TRIMETH/SULFA >=320 RESISTANT Resistant     CLINDAMYCIN <=0.25 SENSITIVE Sensitive     RIFAMPIN <=0.5 SENSITIVE Sensitive     Inducible Clindamycin NEGATIVE Sensitive     LINEZOLID 2 SENSITIVE Sensitive     * ABUNDANT METHICILLIN RESISTANT STAPHYLOCOCCUS AUREUS  Culture, blood (Routine X 2) w Reflex to ID Panel     Status: None (Preliminary result)   Collection Time: 07/24/22  9:35 AM   Specimen: BLOOD RIGHT HAND  Result Value Ref Range Status   Specimen Description BLOOD RIGHT HAND  Final   Special Requests   Final  BOTTLES DRAWN AEROBIC AND ANAEROBIC Blood Culture adequate volume   Culture   Final    NO GROWTH < 24 HOURS Performed at [facility], [address] [place], [place] [address]    Report Status PENDING  Incomplete  Culture, blood (Routine X 2) w Reflex to ID Panel     Status: None (Preliminary result)   Collection Time: 07/24/22  9:36 AM   Specimen: BLOOD LEFT HAND  Result Value Ref Range Status   Specimen Description BLOOD LEFT HAND  Final   Special Requests   Final    BOTTLES DRAWN AEROBIC ONLY Blood Culture results may not be optimal due to an inadequate volume of blood received in culture bottles   Culture   Final    NO GROWTH < 24 HOURS Performed at [facility], [address], [place], [place] [address]    Report Status PENDING  Incomplete     Marcos Eke, NP [facility]  07/25/2022  10:36 AM

## 2022-07-25 NOTE — Progress Notes (Signed)
Occupational Therapy Treatment Patient Details Name: [name] MRN: [id number] DOB: 04-Apr-1985 Today's Date: 07/25/2022   History of present illness 37 y.o. female IVDU who presented to the emergency department with pain and swelling of the left small finger. Underwent L small finger excision of skin and subcutaneous tissue with prox - distal irrigation due infection. 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 PMH: L long finger partial amputation   OT comments  Focus of session on L little finger ROM. Bulky dressing s/p amputation at DIP joint, inferring with ability to move PIP joint. L little MP ROM functional after AA/PROM. Pt asking about resources for drug abuse/living situation - SW made aware. Will continue to follow and recommend outpt OT after DC.    Recommendations for follow up therapy are one component of a multi-disciplinary discharge planning process, led by the attending physician.  Recommendations may be updated based on patient status, additional functional criteria and insurance authorization.    Assistance Recommended at Discharge PRN  Patient can return home with the following      Equipment Recommendations  None recommended by OT    Recommendations for Other Services      Precautions / Restrictions Precautions Precaution Comments: L finger wound; bulky dressing 5/30       Mobility Bed Mobility                    Transfers                         Balance                                           ADL either performed or assessed with clinical judgement   ADL                                         General ADL Comments: ableto complete @ mod I level    Extremity/Trunk Assessment Upper Extremity Assessment Upper Extremity Assessment: LUE deficits/detail LUE Deficits / Details: s/p surgery 5/29  with amputation through the L little DIP jt; Difficulty with PIP mobility due to bulky dressing; ableto achieve @ 90 MP flex with AAROM when therapist blocked below MP joint            Vision       Perception     Praxis      Cognition Arousal/Alertness: Awake/alert Behavior During Therapy: WFL for tasks assessed/performed Overall Cognitive Status: Within Functional Limits for tasks assessed                                          Exercises Hand Exercises Digit Composite Flexion: Left, 15 reps, AAROM Composite Extension: Left, 10 reps, AAROM, AROM Digit Composite Abduction: AAROM, AROM, Right, 10 reps Digit Composite Adduction: AROM, AAROM, Left, 10 reps Digit Lifts: AROM, Left, 10 reps Opposition: AAROM, Left, 10 reps Other Exercises Other Exercises: MP flexion  x 10 hold x 10-20 seconds    Shoulder Instructions       General Comments  Pertinent Vitals/ Pain       Pain Assessment Pain Assessment: Faces Faces Pain Scale: Hurts even more Pain Location: L little finger Pain Descriptors / Indicators: Discomfort, Grimacing, Guarding Pain Intervention(s): Limited activity within patient's tolerance, Patient requesting pain meds-RN notified  Home Living                                          Prior Functioning/Environment              Frequency  Min 4X/week        Progress Toward Goals  OT Goals(current goals can now be found in the care plan section)  Progress towards OT goals: Progressing toward goals  Acute Rehab OT Goals Patient Stated Goal: to get information on drug rehab OT Goal Formulation: With patient Time For Goal Achievement: 08/05/22 Potential to Achieve Goals: Good ADL Goals Pt/caregiver will Perform Home Exercise Program: Increased ROM;With written HEP provided;Independently;Left upper extremity Additional ADL Goal #1: Pt will comlete composite flexion  and incorporate L little finger to achieve  full grasp 1 inch from distal plamer crease to improve funcitonal use of hand  Plan Discharge plan remains appropriate    Co-evaluation                 AM-PAC OT "6 Clicks" Daily Activity     Outcome Measure   Help from another person eating meals?: None Help from another person taking care of personal grooming?: None Help from another person toileting, which includes using toliet, bedpan, or urinal?: None Help from another person bathing (including washing, rinsing, drying)?: None Help from another person to put on and taking off regular upper body clothing?: None Help from another person to put on and taking off regular lower body clothing?: None 6 Click Score: 24    End of Session    OT Visit Diagnosis: Pain;Muscle weakness (generalized) (M62.81) Pain - Right/Left: Left Pain - part of body: Hand   Activity Tolerance Patient tolerated treatment well   Patient Left in bed;with call bell/phone within reach   Nurse Communication Mobility status;Patient requests pain meds        Time: 6295-2841 OT Time Calculation (min): 19 min  Charges: OT General Charges $OT Visit: 1 Visit OT Treatments $Therapeutic Exercise: 8-22 mins  Luisa Dago, OT/L   Acute OT Clinical Specialist Acute Rehabilitation Services Pager [phone] Office [phone]   [facility] 07/25/2022, 1:50 PM

## 2022-07-26 ENCOUNTER — Inpatient Hospital Stay (HOSPITAL_COMMUNITY): Payer: Self-pay

## 2022-07-26 LAB — COMPREHENSIVE METABOLIC PANEL
ALT: 121 U/L — ABNORMAL HIGH (ref 0–44)
AST: 82 U/L — ABNORMAL HIGH (ref 15–41)
Albumin: 2.8 g/dL — ABNORMAL LOW (ref 3.5–5.0)
Alkaline Phosphatase: 84 U/L (ref 38–126)
Anion gap: 12 (ref 5–15)
BUN: 20 mg/dL (ref 6–20)
CO2: 23 mmol/L (ref 22–32)
Calcium: 8.8 mg/dL — ABNORMAL LOW (ref 8.9–10.3)
Chloride: 102 mmol/L (ref 98–111)
Creatinine, Ser: 1.07 mg/dL — ABNORMAL HIGH (ref 0.44–1.00)
GFR, Estimated: >60 mL/min (ref >60)
Glucose, Bld: 138 mg/dL — ABNORMAL HIGH (ref 70–99)
Potassium: 3.9 mmol/L (ref 3.5–5.1)
Sodium: 137 mmol/L (ref 135–145)
Total Bilirubin: 0.4 mg/dL (ref 0.3–1.2)
Total Protein: 6.2 g/dL — ABNORMAL LOW (ref 6.5–8.1)

## 2022-07-26 LAB — CBC WITH DIFFERENTIAL/PLATELET
Abs Immature Granulocytes: 0.04 10*3/uL (ref 0.00–0.07)
Basophils Absolute: 0 10*3/uL (ref 0.0–0.1)
Basophils Relative: 0 %
Eosinophils Absolute: 0 10*3/uL (ref 0.0–0.5)
Eosinophils Relative: 0 %
HCT: 35 % — ABNORMAL LOW (ref 36.0–46.0)
Hemoglobin: 11.4 g/dL — ABNORMAL LOW (ref 12.0–15.0)
Immature Granulocytes: 0 %
Lymphocytes Relative: 23 %
Lymphs Abs: 3.4 10*3/uL (ref 0.7–4.0)
MCH: 29.8 pg (ref 26.0–34.0)
MCHC: 32.6 g/dL (ref 30.0–36.0)
MCV: 91.4 fL (ref 80.0–100.0)
Monocytes Absolute: 1.7 10*3/uL — ABNORMAL HIGH (ref 0.1–1.0)
Monocytes Relative: 11 %
Neutro Abs: 9.9 10*3/uL — ABNORMAL HIGH (ref 1.7–7.7)
Neutrophils Relative %: 66 %
Platelets: 536 10*3/uL — ABNORMAL HIGH (ref 150–400)
RBC: 3.83 MIL/uL — ABNORMAL LOW (ref 3.87–5.11)
RDW: 14 % (ref 11.5–15.5)
WBC: 15 10*3/uL — ABNORMAL HIGH (ref 4.0–10.5)
nRBC: 0 % (ref 0.0–0.2)

## 2022-07-26 LAB — AEROBIC/ANAEROBIC CULTURE W GRAM STAIN (SURGICAL/DEEP WOUND)

## 2022-07-26 LAB — CULTURE, BLOOD (ROUTINE X 2): Culture: NO GROWTH

## 2022-07-26 MED ORDER — MAGIC MOUTHWASH W/LIDOCAINE
5.0000 mL | Freq: Three times a day (TID) | ORAL | Status: DC
  Administered 2022-07-26 – 2022-07-27 (×4): 5 mL via ORAL
  Filled 2022-07-26 (×6): qty 5

## 2022-07-26 MED ORDER — NICOTINE 14 MG/24HR TD PT24
14.0000 mg | MEDICATED_PATCH | Freq: Every day | TRANSDERMAL | Status: DC
  Administered 2022-07-26 – 2022-07-27 (×2): 14 mg via TRANSDERMAL
  Filled 2022-07-26 (×2): qty 1

## 2022-07-26 MED ORDER — MAGIC MOUTHWASH W/LIDOCAINE
5.0000 mL | Freq: Three times a day (TID) | ORAL | Status: DC
  Filled 2022-07-26: qty 5

## 2022-07-26 MED ORDER — CALCIUM CARBONATE ANTACID 500 MG PO CHEW
1.0000 | CHEWABLE_TABLET | Freq: Three times a day (TID) | ORAL | Status: DC | PRN
  Administered 2022-07-26: 200 mg via ORAL
  Filled 2022-07-26: qty 1

## 2022-07-26 MED ORDER — PANTOPRAZOLE SODIUM 40 MG PO TBEC
40.0000 mg | DELAYED_RELEASE_TABLET | Freq: Every day | ORAL | Status: DC
  Administered 2022-07-26 – 2022-07-27 (×2): 40 mg via ORAL
  Filled 2022-07-26 (×2): qty 1

## 2022-07-26 NOTE — Progress Notes (Signed)
  Progress Note   Patient: [name]:[id number] DOB: 09/16/1985 DOA: 07/20/2022     6 DOS: the patient was seen and examined on 07/26/2022   Brief hospital course: [name] is a 37 y.o. female with a history of polysubstance abuse.  Patient presented secondary to pain and swelling of her left finger after insect bite and was found to have concern for acute infection.  Evidence of sepsis on admission as well.  Empiric antibiotics started and blood cultures obtained.  Orthopedic surgery was consulted and performed excision of skin/subcutaneous tissue with proximals-distal irrigation on 5/26.  Wound cultures pending.  Assessment and Plan: Finger infection Left fifth digit.  Blood cultures obtained.  Empiric vancomycin and Zosyn started on admission.  Orthopedic surgery consulted on admission and performed an excision of skin/subcutaneous tissue with proximal-distal irrigation on 5/26.   Wound culture significant for methicillin resistant staphylococcus aureus. -Pt had been continued on vanc per ID recs -Per Hand Surgeon following. 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 Orthopedics to re-evaluate today   Sepsis Present on admission.  Source is her finger infection and secondary to MRSA seen on wound culture.  Patient started empirically on antibiotics on admission with blood cultures obtained.  Blood cultures so far no growth to date.  -Remains afebrile   Elevated AST/ALT Unclear etiology. Possibly related to acute infection. Overall appears stable   Thrombocytosis Mildly elevated platelets of 406,000 on admission.  Likely reactive and secondary to acute infection and surgery    Polysubstance abuse Patient has a history of tobacco cocaine, marijuana use.  Current toxicology screen was  significant for positive cocaine and THC.  Toxicology screen also positive for opiates, however patient received morphine prior to toxicology screen. -Will need cessation  Sore throat -ordered and reviewed xray, negative -Will give trial of PPI with tums and magic mouthwash   Subjective: Complains of sore throat this AM  Physical Exam: Vitals:   07/25/22 2040 07/26/22 0614 07/26/22 0856 07/26/22 1548  BP: (!) 98/52 104/65 101/60 120/77  Pulse: 84 80 73 80  Resp: 20 20 18 18   Temp: 98.1 F (36.7 C) 99 F (37.2 C) 98.6 F (37 C) 98.8 F (37.1 C)  TempSrc: Oral Oral Oral Oral  SpO2: 99% 100% 100% 100%  Weight:      Height:       General exam: Awake, laying in bed, in nad Respiratory system: Normal respiratory effort, no wheezing Cardiovascular system: regular rate, s1, s2 Gastrointestinal system: Soft, nondistended, positive BS Central nervous system: CN2-12 grossly intact, strength intact Extremities: Perfused, no clubbing Skin: Normal skin turgor, no notable skin lesions seen Psychiatry: Mood normal // no visual hallucinations   Data Reviewed:  Labs reviewed: Na 137, K 3.9, Cr 1.07  Family Communication: Pt in room, family not at bedside  Disposition: Status is: Inpatient Remains inpatient appropriate because: Severity of illness  Planned Discharge Destination: Home    Author: Rickey Barbara, MD 07/26/2022 4:58 PM  For on call review [email or web address].

## 2022-07-26 NOTE — Progress Notes (Signed)
  PATIENT ID: [name]  MRN: [id number]  DOB/AGE:  [birth year] / 37 y.o.  07-21-22 I&D L SF 07-24-22 L SF partial amp  Subjective: Pain is found soundly sleeping, requiring me to awaken her to examine her finger.  Reports finger feels better, less pain.  Current dressing is soaking wet as patient was allowed to shower with the fresh dressing on??    Objective: Vital signs in last 24 hours: Temp:  [98.1 F (36.7 C)-99 F (37.2 C)] 98.8 F (37.1 C) (05/31 1548) Pulse Rate:  [73-84] 80 (05/31 1548) Resp:  [18-20] 18 (05/31 1548) BP: (98-120)/(52-77) 120/77 (05/31 1548) SpO2:  [99 %-100 %] 100 % (05/31 1548)  Intake/Output from previous day: 05/30 0701 - 05/31 0700 In: 600 [IV Piggyback:600] Out: 0  Intake/Output this shift: No intake/output data recorded.  Recent Labs    07/25/22 0130 07/26/22 0306  HGB 12.9 11.4*    Recent Labs    07/25/22 0130 07/26/22 0306  WBC 19.4* 15.0*  RBC 4.37 3.83*  HCT 39.6 35.0*  PLT 571* 536*    Recent Labs    07/25/22 0130 07/26/22 0306  NA 135 137  K 3.9 3.9  CL 99 102  CO2 25 23  BUN 15 20  CREATININE 0.80 1.07*  GLUCOSE 135* 138*  CALCIUM 9.3 8.8*    No results for input(s): "LABPT", "INR" in the last 72 hours.  Physical Exam: L SF looking much improved, no expressible drainage, swelling decreasing and closure intact.  No adverse changes in the palm.  Assessment/Plan: S/p L SF partial amp  -Continue wound care--cleansing, VERY LIGHT dressing -Antibiotic/infection plan per ID and hospitalist service. -Continue OT  May d/c at any time now per hand surgery.  Regarding my domain with patient, I will plan to have her back in 2 weeks to remove sutures and assess need for continued outpatient therapy.    Will need outpatient OT upon d/c, order has been written into d/c orders    Onalee Hua A. Janee Morn, MD      Orthopaedic & Hand Surgery [facility] [address] [place], [place]   [address] Office: [phone]  07/26/2022, 5:46 PM

## 2022-07-26 NOTE — Progress Notes (Signed)
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 Underwent L small finger excision of skin and subcutaneous tissue with prox - distal irrigation due infection. 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 PMH: L long finger partial amputation   OT comments  Pt continues to be limited in AROM of L little finger joints. Pt presented with bulky dressing on little finger, although it was rewrapped moments prior by RN at hospitalist request, which limited ability to fully mobilize all joints. Pt completing digit composite flexion with AAROM from OT, remains about an inch from palm but unable to accurately assess due to bulky dressing. Performed exercises and activities to promote ROM of PIPs and desensitization. OT will continue to follow pt acutely to address listed deficits, may need to discuss with RN on reducing size of dressings to allow for more functional activities/exercises with DIPs. DC plans remain appropriate for outpatient OT.    Recommendations for follow up therapy are one component of a multi-disciplinary discharge planning process, led by the attending physician.  Recommendations may be updated based on patient status, additional functional criteria and insurance authorization.    Assistance Recommended at Discharge PRN  Patient can return home with the following      Equipment Recommendations  None recommended by OT    Recommendations for Other Services      Precautions / Restrictions Precautions Precautions: Other (comment) (L finger open wound) Precaution Comments: L finger wound; bulky dressing 5/31       Mobility Bed Mobility Overal bed mobility: Independent                   Transfers                         Balance Overall balance assessment: Mild deficits observed, not formally tested                                         ADL either performed or assessed with clinical judgement   ADL                                         General ADL Comments: able to complete at mod I level    Extremity/Trunk Assessment              Vision       Perception     Praxis      Cognition Arousal/Alertness: Awake/alert Behavior During Therapy: WFL for tasks assessed/performed Overall Cognitive Status: Within Functional Limits for tasks assessed                                          Exercises Hand Exercises Digit Composite Flexion: Left, 15 reps, AAROM, AROM (with 10 sec holds, x2 sets) Composite Extension: Left, 10 reps, AAROM, AROM Opposition: Left, AROM, 10 reps Other Exercises Other Exercises: L little finger tapping for desensitization  Other Exercises: L little finger flicking straw and lip balm to promote PIP extension, x10 reps each object.    Shoulder Instructions       General Comments      Pertinent Vitals/ Pain       Pain Assessment Pain Assessment: Faces Faces Pain Scale: Hurts little more Pain Location: L little finger with ROM Pain Descriptors / Indicators: Discomfort, Grimacing, Guarding Pain Intervention(s): Monitored during session, Limited activity within patient's tolerance  Home Living                                          Prior Functioning/Environment              Frequency  Min 4X/week        Progress Toward Goals  OT Goals(current goals can now be found in the care plan section)  Progress towards OT goals: Progressing toward goals  Acute Rehab OT Goals OT Goal Formulation: With patient Time For Goal Achievement: 08/05/22 Potential to Achieve Goals: Good  Plan Discharge plan remains  appropriate    Co-evaluation                 AM-PAC OT "6 Clicks" Daily Activity     Outcome Measure   Help from another person eating meals?: None Help from another person taking care of personal grooming?: None Help from another person toileting, which includes using toliet, bedpan, or urinal?: None Help from another person bathing (including washing, rinsing, drying)?: None Help from another person to put on and taking off regular upper body clothing?: None Help from another person to put on and taking off regular lower body clothing?: None 6 Click Score: 24    End of Session    OT Visit Diagnosis: Pain;Muscle weakness (generalized) (M62.81) Pain - Right/Left: Left Pain - part of body: Hand   Activity Tolerance Patient tolerated treatment well   Patient Left in bed;with call bell/phone within reach   Nurse Communication Mobility status        Time: 1610-9604 OT Time Calculation (min): 17 min  Charges: OT General Charges $OT Visit: 1 Visit OT Treatments $Therapeutic Exercise: 8-22 mins  07/26/2022  AB, OTR/L  Acute Rehabilitation Services  Office: [phone]   Tristan Schroeder 07/26/2022, 6:28 PM

## 2022-07-27 LAB — CBC
HCT: 34.6 % — ABNORMAL LOW (ref 36.0–46.0)
Hemoglobin: 11.2 g/dL — ABNORMAL LOW (ref 12.0–15.0)
MCH: 29.9 pg (ref 26.0–34.0)
MCHC: 32.4 g/dL (ref 30.0–36.0)
MCV: 92.5 fL (ref 80.0–100.0)
Platelets: 535 10*3/uL — ABNORMAL HIGH (ref 150–400)
RBC: 3.74 MIL/uL — ABNORMAL LOW (ref 3.87–5.11)
RDW: 13.8 % (ref 11.5–15.5)
WBC: 9.7 10*3/uL (ref 4.0–10.5)
nRBC: 0 % (ref 0.0–0.2)

## 2022-07-27 LAB — COMPREHENSIVE METABOLIC PANEL
ALT: 132 U/L — ABNORMAL HIGH (ref 0–44)
AST: 62 U/L — ABNORMAL HIGH (ref 15–41)
Albumin: 3 g/dL — ABNORMAL LOW (ref 3.5–5.0)
Alkaline Phosphatase: 72 U/L (ref 38–126)
Anion gap: 7 (ref 5–15)
BUN: 22 mg/dL — ABNORMAL HIGH (ref 6–20)
CO2: 24 mmol/L (ref 22–32)
Calcium: 8.9 mg/dL (ref 8.9–10.3)
Chloride: 103 mmol/L (ref 98–111)
Creatinine, Ser: 0.96 mg/dL (ref 0.44–1.00)
GFR, Estimated: >60 mL/min (ref >60)
Glucose, Bld: 99 mg/dL (ref 70–99)
Potassium: 4.1 mmol/L (ref 3.5–5.1)
Sodium: 134 mmol/L — ABNORMAL LOW (ref 135–145)
Total Bilirubin: 0.1 mg/dL — ABNORMAL LOW (ref 0.3–1.2)
Total Protein: 6.2 g/dL — ABNORMAL LOW (ref 6.5–8.1)

## 2022-07-27 MED ORDER — OXYCODONE HCL 5 MG PO TABS: 5.0000 mg | ORAL_TABLET | Freq: Four times a day (QID) | ORAL | 0 refills | Status: DC | PRN

## 2022-07-27 MED ORDER — DOXYCYCLINE HYCLATE 100 MG PO CAPS: 100.0000 mg | ORAL_CAPSULE | Freq: Two times a day (BID) | ORAL | 0 refills | Status: AC

## 2022-07-27 MED ORDER — PANTOPRAZOLE SODIUM 40 MG PO TBEC: 40.0000 mg | DELAYED_RELEASE_TABLET | Freq: Every day | ORAL | 0 refills | Status: DC

## 2022-07-27 NOTE — TOC Transition Note (Signed)
Transition of Care [facility]) - CM/SW Discharge Note   Patient Details  Name: [name] MRN: [id number] Date of Birth: [birth year]  [facility]) CM/SW Contact:  Helene Kelp, LCSW Phone Number: 07/27/2022, 4:25 PM   Clinical Narrative:    CSW contacted by clinical team for d/c support. This Clinical research associate provided clothes and bus-pass to the patient. Patient was receptive and ready for discharge.   No other needs identified by this Clinical research associate or requested.     Final next level of care: Other (comment) (Patient homeless and noted going to a friend house) Barriers to Discharge: No Barriers Identified   Patient Goals and CMS Choice CMS Medicare.gov Compare Post Acute Care list provided to:: Patient    Discharge Placement                      Patient and family notified of of transfer: 07/27/22  Discharge Plan and Services Additional resources added to the After Visit Summary for                                       Social Determinants of Health (SDOH) Interventions SDOH Screenings   Tobacco Use: High Risk (07/25/2022)     Readmission Risk Interventions     No data to display

## 2022-07-27 NOTE — Progress Notes (Signed)
Discharge instructions provided to patient. All medications and discharge instructions discussed.

## 2022-07-27 NOTE — Progress Notes (Signed)
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 Underwent L small finger excision of skin and subcutaneous tissue with prox - distal irrigation due infection. 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 PMH: L long finger partial amputation   OT comments  OT removed pt's dressing to get more AROM from PIP and DIP of L small finger in today's session, Upon dressing removal pt writhing in pain stating that it burns. Pt still willing to complete exercises (see below) but session was limited due to pain with exercises and pain during removal of dressing as patient was a bit restless. Reinforced Pt complete AROM exercises given in HEP upon DC. Discussed with pt cleaning and care of L small finger and importance of keeping open wound protected during bathing. RN notified of patient's pain level and need for dressing change. OT to continue to progress pt as able, DC plans remain appropriate for pt to follow up with outpatient OT when able.   Recommendations for follow up therapy are one component of a multi-disciplinary discharge planning process, led by the attending physician.  Recommendations may be updated based on patient status, additional functional criteria and insurance authorization.    Assistance Recommended at Discharge PRN  Patient can return home with the following      Equipment Recommendations  None recommended by OT    Recommendations for Other Services      Precautions / Restrictions Precautions Precautions: Other (comment) Precaution Comments: L finger wound; bulky dressing 5/31 Restrictions Weight Bearing Restrictions: No       Mobility Bed Mobility Overal bed  mobility: Independent                  Transfers                         Balance Overall balance assessment: Mild deficits observed, not formally tested                                         ADL either performed or assessed with clinical judgement   ADL                                         General ADL Comments: Focused session on finger AROM/AAROM    Extremity/Trunk Assessment              Vision       Perception     Praxis      Cognition Arousal/Alertness: Awake/alert Behavior During Therapy: WFL for tasks assessed/performed Overall Cognitive Status: Within Functional Limits for tasks assessed                                          Exercises Hand Exercises Digit Composite Flexion: AROM, Left, 5 reps Composite Extension: AROM, Left, 5 reps Digit Composite Abduction: PROM, Left, 10 reps Digit Composite Adduction: PROM, Left, 10 reps Other  Exercises Other Exercises: Little finger light strokes for desensitization Other Exercises: Isolated PROM L little finger PIP extension and flexion, x10 reps each    Shoulder Instructions       General Comments Pt reporting burning sensation in little finger after removing dressing. She reports the "open air" burns it    Pertinent Vitals/ Pain       Pain Assessment Pain Assessment: Faces Faces Pain Scale: Hurts whole lot Pain Location: L little fimger after removing dressing Pain Descriptors / Indicators: Discomfort, Grimacing, Guarding, Tender, Burning Pain Intervention(s): Patient requesting pain meds-RN notified, Monitored during session, Limited activity within patient's tolerance  Home Living                                          Prior Functioning/Environment              Frequency  Min 4X/week        Progress Toward Goals  OT Goals(current goals can now be found in the care plan section)  Progress  towards OT goals: Progressing toward goals  Acute Rehab OT Goals Patient Stated Goal: to get information on drug rehab OT Goal Formulation: With patient Time For Goal Achievement: 08/05/22 Potential to Achieve Goals: Good  Plan Discharge plan remains appropriate    Co-evaluation                 AM-PAC OT "6 Clicks" Daily Activity     Outcome Measure   Help from another person eating meals?: None Help from another person taking care of personal grooming?: None Help from another person toileting, which includes using toliet, bedpan, or urinal?: None Help from another person bathing (including washing, rinsing, drying)?: None Help from another person to put on and taking off regular upper body clothing?: None Help from another person to put on and taking off regular lower body clothing?: None 6 Click Score: 24    End of Session    OT Visit Diagnosis: Pain;Muscle weakness (generalized) (M62.81) Pain - Right/Left: Left Pain - part of body: Hand   Activity Tolerance Patient limited by pain   Patient Left in bed;with call bell/phone within reach;with nursing/sitter in room   Nurse Communication Mobility status        Time: 4098-1191 OT Time Calculation (min): 30 min  Charges: OT General Charges $OT Visit: 1 Visit OT Treatments $Therapeutic Exercise: 23-37 mins  07/27/2022  AB, OTR/L  Acute Rehabilitation Services  Office: [phone]   [name] 07/27/2022, 4:09 PM

## 2022-07-27 NOTE — Progress Notes (Signed)
Assisted pt with her prescriptions through the [facility] program.

## 2022-07-27 NOTE — Discharge Summary (Signed)
Physician Discharge Summary   Patient: [name] MRN: [id number] DOB: 28-Apr-1985  Admit date:     07/20/2022  Discharge date: 07/27/22  Discharge Physician: Rickey Barbara   PCP: Centricity, User, MD (Inactive)   Recommendations at discharge:    Follow up with PCP in 1-2 weeks Follow up with Dr. Mack Hook in 2 weeks Follow up with Infectious Disease as scheduled  Discharge Diagnoses: Principal Problem:   Finger infection Active Problems:   Leukocytosis   Thrombocytosis   Polysubstance abuse (HCC)   MRSA infection  Resolved Problems:   * No resolved hospital problems. *  Hospital Course: [name] is a 37 y.o. female with a history of polysubstance abuse.  Patient presented secondary to pain and swelling of her left finger after insect bite and was found to have concern for acute infection.  Evidence of sepsis on admission as well.  Empiric antibiotics started and blood cultures obtained.  Orthopedic surgery was consulted and performed excision of skin/subcutaneous tissue with proximals-distal irrigation on 5/26.  Wound cultures pending.  Assessment and Plan: Finger infection Left fifth digit.  Blood cultures obtained.  Empiric vancomycin and Zosyn started on admission.  Orthopedic surgery consulted on admission and performed an excision of skin/subcutaneous tissue with proximal-distal irrigation on 5/26.   Wound culture significant for methicillin resistant staphylococcus aureus. -Pt had been continued on vanc per ID recs -Per Hand Surgeon following. 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 OK to d/c per Orthopedic Surgery   Sepsis Present on admission.  Source is her finger infection and secondary to MRSA seen on wound culture.  Patient started empirically on antibiotics on  admission with blood cultures obtained.  Blood cultures so far no growth to date.  -Remains afebrile   Elevated AST/ALT Unclear etiology. Possibly related to acute infection. Overall appears stable   Thrombocytosis Mildly elevated platelets of 406,000 on admission.  Likely reactive and secondary to acute infection and surgery    Polysubstance abuse Patient has a history of tobacco cocaine, marijuana use.  Current toxicology screen was significant for positive cocaine and THC.  Toxicology screen also positive for opiates, however patient received morphine prior to toxicology screen.   Sore throat -ordered and reviewed xray, negative -Improved with PPI    Consultants: Hydrographic surveyor, ID Procedures performed: I/D and  amputation of L small finger Disposition: Home Diet recommendation:  Regular diet DISCHARGE MEDICATION: Allergies as of 07/27/2022   No Known Allergies      Medication List     TAKE these medications    acetaminophen 500 MG tablet Commonly known as: TYLENOL Take 2 tablets (1,000 mg total) by mouth every 6 (six) hours as needed.   doxycycline 100 MG capsule Commonly known as: VIBRAMYCIN Take 1 capsule (100 mg total) by mouth 2 (two) times daily for 25 days. Stop date 08/21/22   ibuprofen 200 MG tablet Commonly known as: ADVIL Take 800 mg by mouth every 6 (six) hours as needed for mild pain.   oxyCODONE 5 MG immediate release tablet Commonly known as: Oxy IR/ROXICODONE Take 1 tablet (5 mg total) by mouth every 6 (six) hours as needed for moderate pain.   pantoprazole 40 MG tablet Commonly known as: PROTONIX Take 1 tablet (40 mg total) by mouth daily. Start taking on: July 28, 2022  Follow-up Information     Veryl Speak, FNP Follow up.   Specialty: Infectious Diseases Why: 08/14/22 at 1:45 pm. Please call to reschedule if you are not able to make this appointment. Contact information: [address] [place]  [address] [phone]         Mack Hook, MD. Schedule an appointment as soon as possible for a visit.   Specialty: Orthopedic Surgery Why: for approx 2 weeks from 07-24-22 Contact information: [address] [place] [address] [phone]         Follow up with PCP in 1-2 weeks Follow up.   Why: Hospital follow up               Discharge Exam: Filed Weights   07/20/22 0900 07/21/22 0844  Weight: 56.7 kg 56.7 kg   General exam: Awake, laying in bed, in nad Respiratory system: Normal respiratory effort, no wheezing Cardiovascular system: regular rate, s1, s2 Gastrointestinal system: Soft, nondistended, positive BS Central nervous system: CN2-12 grossly intact, strength intact Extremities: L hand with post-op dressings in place Skin: Normal skin turgor, no notable skin lesions seen Psychiatry: Mood normal // no visual hallucinations   Condition at discharge: fair  The results of significant diagnostics from this hospitalization (including imaging, microbiology, ancillary and laboratory) are listed below for reference.   Imaging Studies: DG Neck Soft Tissue  Result Date: 07/26/2022 CLINICAL DATA:  Neck swelling.  Difficulty swallowing. EXAM: NECK SOFT TISSUES - 2 VIEW COMPARISON:  None Available. FINDINGS: There is no evidence of retropharyngeal soft tissue swelling or epiglottic enlargement. The cervical airway is unremarkable and no radio-opaque foreign body identified. Straightening of the normal cervical lordosis is noted. The lung apices are clear. IMPRESSION: Negative soft tissue neck radiographs. Electronically Signed   By: Marin Roberts M.D.   On: 07/26/2022 16:29   DG CHEST PORT 1 VIEW  Result Date: 07/24/2022 CLINICAL DATA:  Fever EXAM: PORTABLE CHEST 1 VIEW COMPARISON:  X-ray 10/14/2019 and CT angiogram FINDINGS: Minimal left basilar scar or atelectasis. No consolidation, pneumothorax, effusion or edema. Normal cardiopericardial silhouette.  IMPRESSION: Minimal left basilar scar or atelectasis. Electronically Signed   By: Karen Kays M.D.   On: 07/24/2022 11:09   DG Finger Little Left  Result Date: 07/20/2022 CLINICAL DATA:  Infection.  Left small finger spider bite. EXAM: LEFT FINGER(S) - 2+ VIEW COMPARISON:  Left hand x-rays dated August 30, 2016. FINDINGS: Severe soft tissue swelling of the small finger. No bony destruction or periosteal reaction. No acute fracture or dislocation. Joint spaces are preserved. Bone mineralization is normal. IMPRESSION: 1. Severe soft tissue swelling of the small finger. No acute osseous abnormality. Electronically Signed   By: Obie Dredge M.D.   On: 07/20/2022 09:10    Microbiology: Results for orders placed or performed during the hospital encounter of 07/20/22  Blood culture (routine x 2)     Status: None   Collection Time: 07/20/22  8:54 AM   Specimen: BLOOD  Result Value Ref Range Status   Specimen Description BLOOD SITE NOT SPECIFIED  Final   Special Requests   Final    BOTTLES DRAWN AEROBIC AND ANAEROBIC Blood Culture results may not be optimal due to an inadequate volume of blood received in culture bottles   Culture   Final    NO GROWTH 5 DAYS Performed at [facility], [address], [place], [place] [address]    Report Status 07/25/2022 FINAL  Final  Blood  culture (routine x 2)     Status: None   Collection Time: 07/20/22  9:18 AM   Specimen: BLOOD  Result Value Ref Range Status   Specimen Description BLOOD SITE NOT SPECIFIED  Final   Special Requests   Final    BOTTLES DRAWN AEROBIC AND ANAEROBIC Blood Culture adequate volume   Culture   Final    NO GROWTH 5 DAYS Performed at [facility], [address], [place], [place] [address]    Report Status 07/25/2022 FINAL  Final  Aerobic/Anaerobic Culture w Gram Stain (surgical/deep wound)     Status: None   Collection Time: 07/21/22 10:13 AM   Specimen: Finger, Left; Abscess  Result Value Ref Range Status    Specimen Description ABSCESS  Final   Special Requests LEFT SMALL FINGER, ZOSYN, VANCOMYCIN  Final   Gram Stain   Final    RARE WBC PRESENT, PREDOMINANTLY MONONUCLEAR FEW GRAM POSITIVE COCCI IN PAIRS IN CLUSTERS    Culture   Final    ABUNDANT METHICILLIN RESISTANT STAPHYLOCOCCUS AUREUS NO ANAEROBES ISOLATED Performed at [facility], [address] [place], [place] [address]    Report Status 07/26/2022 FINAL  Final   Organism ID, Bacteria METHICILLIN RESISTANT STAPHYLOCOCCUS AUREUS  Final      Susceptibility   Methicillin resistant staphylococcus aureus - MIC*    CIPROFLOXACIN >=8 RESISTANT Resistant     ERYTHROMYCIN >=8 RESISTANT Resistant     GENTAMICIN <=0.5 SENSITIVE Sensitive     OXACILLIN >=4 RESISTANT Resistant     TETRACYCLINE <=1 SENSITIVE Sensitive     VANCOMYCIN <=0.5 SENSITIVE Sensitive     TRIMETH/SULFA >=320 RESISTANT Resistant     CLINDAMYCIN <=0.25 SENSITIVE Sensitive     RIFAMPIN <=0.5 SENSITIVE Sensitive     Inducible Clindamycin NEGATIVE Sensitive     LINEZOLID 2 SENSITIVE Sensitive     * ABUNDANT METHICILLIN RESISTANT STAPHYLOCOCCUS AUREUS  Culture, blood (Routine X 2) w Reflex to ID Panel     Status: None (Preliminary result)   Collection Time: 07/24/22  9:35 AM   Specimen: BLOOD RIGHT HAND  Result Value Ref Range Status   Specimen Description BLOOD RIGHT HAND  Final   Special Requests   Final    BOTTLES DRAWN AEROBIC AND ANAEROBIC Blood Culture adequate volume   Culture   Final    NO GROWTH 3 DAYS Performed at [facility], [address], [place], [place] [address]    Report Status PENDING  Incomplete  Culture, blood (Routine X 2) w Reflex to ID Panel     Status: None (Preliminary result)   Collection Time: 07/24/22  9:36 AM   Specimen: BLOOD LEFT HAND  Result Value Ref Range Status   Specimen Description BLOOD LEFT HAND  Final   Special Requests   Final    BOTTLES DRAWN AEROBIC ONLY Blood Culture results may not be optimal due to an  inadequate volume of blood received in culture bottles   Culture   Final    NO GROWTH 3 DAYS Performed at [facility], [address], [place], [place] [address]    Report Status PENDING  Incomplete    Labs: CBC: Recent Labs  Lab 07/22/22 0329 07/23/22 0231 07/25/22 0130 07/26/22 0306 07/27/22 0147  WBC 12.1* 8.4 19.4* 15.0* 9.7  NEUTROABS  --   --   --  9.9*  --   HGB 12.2 12.1 12.9 11.4* 11.2*  HCT 36.8 37.0 39.6 35.0* 34.6*  MCV 91.1 90.0 90.6 91.4 92.5  PLT 394 427* 571* 536* 535*   Basic Metabolic Panel: Recent Labs  Lab 07/22/22 1020 07/23/22 0231 07/25/22 0130 07/26/22 0306 07/27/22 0147  NA 135 133* 135 137 134*  K 3.7 4.1 3.9 3.9 4.1  CL 101 100 99 102 103  CO2 25 24 25 23 24   GLUCOSE 97 108* 135* 138* 99  BUN 13 16 15 20  22*  CREATININE 0.79 0.92 0.80 1.07* 0.96  CALCIUM 8.6* 8.7* 9.3 8.8* 8.9   Liver Function Tests: Recent Labs  Lab 07/21/22 0247 07/23/22 0231 07/25/22 0130 07/26/22 0306 07/27/22 0147  AST 79* 49* 51* 82* 62*  ALT 119* 78* 95* 121* 132*  ALKPHOS 122 93 101 84 72  BILITOT 0.5 0.4 0.5 0.4 0.1*  PROT 6.2* 6.2* 7.6 6.2* 6.2*  ALBUMIN 2.9* 2.7* 3.3* 2.8* 3.0*   CBG: No results for input(s): "GLUCAP" in the last 168 hours.  Discharge time spent: less than 30 minutes.  Signed: Rickey Barbara, MD [facility] 07/27/2022

## 2022-07-27 NOTE — TOC Progression Note (Signed)
Transition of Care [facility]) - Progression Note    Patient Details  Name: [name] MRN: [id number] Date of Birth: 1985-12-02  Transition of Care [facility]) CM/SW Contact  Helene Kelp, [place] Phone Number: 07/27/2022, 4:15 PM  Clinical Narrative:    CSW was contacted by clinical team members regarding [employer] (clothes and transportation assistance).   This Clinical research associate met with the patient and provided her with clothes and a bus pass. Patient was agreeable and thankful for the support an help provided.   CSW updated the charge nurse to the efforts noted above.  No other needs idetnifed and patient ready to discharge.   Expected Discharge Plan and Services    Expected Discharge Date: 07/27/22                 Social Determinants of Health (SDOH) Interventions SDOH Screenings   Tobacco Use: High Risk (07/25/2022)    Readmission Risk Interventions     No data to display

## 2022-07-28 LAB — CULTURE, BLOOD (ROUTINE X 2)

## 2022-07-29 LAB — CULTURE, BLOOD (ROUTINE X 2)
Culture: NO GROWTH
Special Requests: ADEQUATE

## 2022-08-14 ENCOUNTER — Inpatient Hospital Stay: Payer: Self-pay | Admitting: Family

## 2022-12-10 ENCOUNTER — Other Ambulatory Visit: Payer: Self-pay

## 2022-12-10 ENCOUNTER — Inpatient Hospital Stay (HOSPITAL_COMMUNITY)
Admission: EM | Admit: 2022-12-10 | Discharge: 2022-12-12 | DRG: 871 | Disposition: A | Discharge status: Home / Self Care | Payer: Self-pay | Attending: Internal Medicine | Admitting: Internal Medicine

## 2022-12-10 ENCOUNTER — Emergency Department (HOSPITAL_COMMUNITY): Payer: Self-pay

## 2022-12-10 ENCOUNTER — Encounter (HOSPITAL_COMMUNITY): Payer: Self-pay | Admitting: Internal Medicine

## 2022-12-10 DIAGNOSIS — Z1152 Encounter for screening for COVID-19: Secondary | ICD-10-CM

## 2022-12-10 DIAGNOSIS — Z5900 Homelessness unspecified: Secondary | ICD-10-CM

## 2022-12-10 DIAGNOSIS — F1010 Alcohol abuse, uncomplicated: Secondary | ICD-10-CM | POA: Diagnosis present

## 2022-12-10 DIAGNOSIS — A419 Sepsis, unspecified organism: Principal | ICD-10-CM | POA: Diagnosis present

## 2022-12-10 DIAGNOSIS — Z89022 Acquired absence of left finger(s): Secondary | ICD-10-CM

## 2022-12-10 DIAGNOSIS — Z5901 Sheltered homelessness: Secondary | ICD-10-CM

## 2022-12-10 DIAGNOSIS — F1290 Cannabis use, unspecified, uncomplicated: Secondary | ICD-10-CM | POA: Diagnosis present

## 2022-12-10 DIAGNOSIS — F1410 Cocaine abuse, uncomplicated: Secondary | ICD-10-CM | POA: Diagnosis present

## 2022-12-10 DIAGNOSIS — E876 Hypokalemia: Secondary | ICD-10-CM | POA: Diagnosis present

## 2022-12-10 DIAGNOSIS — J189 Pneumonia, unspecified organism: Principal | ICD-10-CM | POA: Diagnosis present

## 2022-12-10 DIAGNOSIS — R6520 Severe sepsis without septic shock: Secondary | ICD-10-CM | POA: Insufficient documentation

## 2022-12-10 DIAGNOSIS — G928 Other toxic encephalopathy: Secondary | ICD-10-CM | POA: Diagnosis present

## 2022-12-10 DIAGNOSIS — F17210 Nicotine dependence, cigarettes, uncomplicated: Secondary | ICD-10-CM | POA: Diagnosis present

## 2022-12-10 DIAGNOSIS — G9341 Metabolic encephalopathy: Secondary | ICD-10-CM | POA: Diagnosis present

## 2022-12-10 LAB — URINALYSIS, W/ REFLEX TO CULTURE (INFECTION SUSPECTED)
Bilirubin Urine: NEGATIVE
Glucose, UA: NEGATIVE mg/dL
Hgb urine dipstick: NEGATIVE
Ketones, ur: NEGATIVE mg/dL
Leukocytes,Ua: NEGATIVE
Nitrite: NEGATIVE
Protein, ur: NEGATIVE mg/dL
Specific Gravity, Urine: 1.016 (ref 1.005–1.030)
pH: 7 (ref 5.0–8.0)

## 2022-12-10 LAB — COMPREHENSIVE METABOLIC PANEL
ALT: 20 U/L (ref 0–44)
AST: 27 U/L (ref 15–41)
Albumin: 4.2 g/dL (ref 3.5–5.0)
Alkaline Phosphatase: 72 U/L (ref 38–126)
Anion gap: 13 (ref 5–15)
BUN: 7 mg/dL (ref 6–20)
CO2: 23 mmol/L (ref 22–32)
Calcium: 9.4 mg/dL (ref 8.9–10.3)
Chloride: 101 mmol/L (ref 98–111)
Creatinine, Ser: 0.83 mg/dL (ref 0.44–1.00)
GFR, Estimated: >60 mL/min (ref >60)
Glucose, Bld: 93 mg/dL (ref 70–99)
Potassium: 3.1 mmol/L — ABNORMAL LOW (ref 3.5–5.1)
Sodium: 137 mmol/L (ref 135–145)
Total Bilirubin: 0.5 mg/dL (ref 0.3–1.2)
Total Protein: 7.7 g/dL (ref 6.5–8.1)

## 2022-12-10 LAB — CBC WITH DIFFERENTIAL/PLATELET
Abs Immature Granulocytes: 0.04 10*3/uL (ref 0.00–0.07)
Basophils Absolute: 0.1 10*3/uL (ref 0.0–0.1)
Basophils Relative: 0 %
Eosinophils Absolute: 0 10*3/uL (ref 0.0–0.5)
Eosinophils Relative: 0 %
HCT: 43.3 % (ref 36.0–46.0)
Hemoglobin: 14.5 g/dL (ref 12.0–15.0)
Immature Granulocytes: 0 %
Lymphocytes Relative: 7 %
Lymphs Abs: 1 10*3/uL (ref 0.7–4.0)
MCH: 29.8 pg (ref 26.0–34.0)
MCHC: 33.5 g/dL (ref 30.0–36.0)
MCV: 89.1 fL (ref 80.0–100.0)
Monocytes Absolute: 1.1 10*3/uL — ABNORMAL HIGH (ref 0.1–1.0)
Monocytes Relative: 8 %
Neutro Abs: 12 10*3/uL — ABNORMAL HIGH (ref 1.7–7.7)
Neutrophils Relative %: 85 %
Platelets: 427 10*3/uL — ABNORMAL HIGH (ref 150–400)
RBC: 4.86 MIL/uL (ref 3.87–5.11)
RDW: 12.8 % (ref 11.5–15.5)
WBC: 14.1 10*3/uL — ABNORMAL HIGH (ref 4.0–10.5)
nRBC: 0 % (ref 0.0–0.2)

## 2022-12-10 LAB — RESP PANEL BY RT-PCR (RSV, FLU A&B, COVID)  RVPGX2
Influenza A by PCR: NEGATIVE
Influenza B by PCR: NEGATIVE
Resp Syncytial Virus by PCR: NEGATIVE
SARS Coronavirus 2 by RT PCR: NEGATIVE

## 2022-12-10 LAB — PROTIME-INR
INR: 0.9 (ref 0.8–1.2)
Prothrombin Time: 12.8 s (ref 11.4–15.2)

## 2022-12-10 LAB — RAPID URINE DRUG SCREEN, HOSP PERFORMED
Amphetamines: NOT DETECTED
Barbiturates: NOT DETECTED
Benzodiazepines: NOT DETECTED
Cocaine: POSITIVE — AB
Opiates: NOT DETECTED
Tetrahydrocannabinol: POSITIVE — AB

## 2022-12-10 LAB — I-STAT CG4 LACTIC ACID, ED: Lactic Acid, Venous: 1.4 mmol/L (ref 0.5–1.9)

## 2022-12-10 LAB — STREP PNEUMONIAE URINARY ANTIGEN: Strep Pneumo Urinary Antigen: NEGATIVE

## 2022-12-10 LAB — HCG, SERUM, QUALITATIVE: Preg, Serum: NEGATIVE

## 2022-12-10 LAB — PROCALCITONIN: Procalcitonin: <0.1 ng/mL

## 2022-12-10 MED ORDER — SODIUM CHLORIDE 0.9 % IV SOLN
500.0000 mg | INTRAVENOUS | Status: DC
  Administered 2022-12-11: 500 mg via INTRAVENOUS
  Filled 2022-12-10: qty 5

## 2022-12-10 MED ORDER — POTASSIUM CHLORIDE CRYS ER 20 MEQ PO TBCR
40.0000 meq | EXTENDED_RELEASE_TABLET | Freq: Once | ORAL | Status: AC
  Administered 2022-12-10: 40 meq via ORAL
  Filled 2022-12-10: qty 2

## 2022-12-10 MED ORDER — GUAIFENESIN ER 600 MG PO TB12
600.0000 mg | ORAL_TABLET | Freq: Two times a day (BID) | ORAL | Status: DC
  Administered 2022-12-10 – 2022-12-12 (×5): 600 mg via ORAL
  Filled 2022-12-10 (×5): qty 1

## 2022-12-10 MED ORDER — LORAZEPAM 1 MG PO TABS: 1.0000 mg | ORAL_TABLET | ORAL | Status: DC | PRN

## 2022-12-10 MED ORDER — THIAMINE MONONITRATE 100 MG PO TABS
100.0000 mg | ORAL_TABLET | Freq: Every day | ORAL | Status: DC
  Administered 2022-12-10 – 2022-12-12 (×3): 100 mg via ORAL
  Filled 2022-12-10 (×3): qty 1

## 2022-12-10 MED ORDER — ALBUTEROL SULFATE (2.5 MG/3ML) 0.083% IN NEBU: 2.5000 mg | INHALATION_SOLUTION | Freq: Four times a day (QID) | RESPIRATORY_TRACT | Status: DC | PRN

## 2022-12-10 MED ORDER — ACETAMINOPHEN 650 MG RE SUPP: 650.0000 mg | Freq: Four times a day (QID) | RECTAL | Status: DC | PRN

## 2022-12-10 MED ORDER — ENOXAPARIN SODIUM 40 MG/0.4ML IJ SOSY: 40.0000 mg | PREFILLED_SYRINGE | INTRAMUSCULAR | Status: DC

## 2022-12-10 MED ORDER — THIAMINE HCL 100 MG/ML IJ SOLN: 100.0000 mg | Freq: Every day | INTRAMUSCULAR | Status: DC

## 2022-12-10 MED ORDER — LORAZEPAM 2 MG/ML IJ SOLN: 1.0000 mg | INTRAMUSCULAR | Status: DC | PRN

## 2022-12-10 MED ORDER — ENOXAPARIN SODIUM 40 MG/0.4ML IJ SOSY
40.0000 mg | PREFILLED_SYRINGE | INTRAMUSCULAR | Status: DC
  Administered 2022-12-10 – 2022-12-11 (×2): 40 mg via SUBCUTANEOUS
  Filled 2022-12-10 (×3): qty 0.4

## 2022-12-10 MED ORDER — HYDRALAZINE HCL 20 MG/ML IJ SOLN: 10.0000 mg | Freq: Four times a day (QID) | INTRAMUSCULAR | Status: DC | PRN

## 2022-12-10 MED ORDER — SODIUM CHLORIDE 0.9 % IV SOLN: INTRAVENOUS | Status: DC

## 2022-12-10 MED ORDER — SODIUM CHLORIDE 0.9 % IV SOLN
2.0000 g | Freq: Once | INTRAVENOUS | Status: AC
  Administered 2022-12-10: 2 g via INTRAVENOUS
  Filled 2022-12-10: qty 20

## 2022-12-10 MED ORDER — FOLIC ACID 1 MG PO TABS
1.0000 mg | ORAL_TABLET | Freq: Every day | ORAL | Status: DC
  Administered 2022-12-10 – 2022-12-12 (×3): 1 mg via ORAL
  Filled 2022-12-10 (×3): qty 1

## 2022-12-10 MED ORDER — ACETAMINOPHEN 325 MG PO TABS: 650.0000 mg | ORAL_TABLET | Freq: Four times a day (QID) | ORAL | Status: DC | PRN

## 2022-12-10 MED ORDER — SODIUM CHLORIDE 0.9 % IV SOLN
500.0000 mg | Freq: Once | INTRAVENOUS | Status: AC
  Administered 2022-12-10: 500 mg via INTRAVENOUS
  Filled 2022-12-10: qty 5

## 2022-12-10 MED ORDER — SODIUM CHLORIDE 0.9 % IV SOLN
2.0000 g | INTRAVENOUS | Status: DC
  Administered 2022-12-11 – 2022-12-12 (×2): 2 g via INTRAVENOUS
  Filled 2022-12-10 (×2): qty 20

## 2022-12-10 MED ORDER — ADULT MULTIVITAMIN W/MINERALS CH
1.0000 | ORAL_TABLET | Freq: Every day | ORAL | Status: DC
  Administered 2022-12-10 – 2022-12-12 (×3): 1 via ORAL
  Filled 2022-12-10 (×3): qty 1

## 2022-12-10 NOTE — ED Triage Notes (Signed)
Pt coming in from home . Pt reports that she has been short of breath x3 days and has increasing weakness. Called ems today when she was unable to stand to get to the bathroom..   158/98 Hr 130 Rr 26

## 2022-12-10 NOTE — ED Notes (Signed)
Rn entered room to find pt up attempting to get water out of the sink. Pt reports "I'm so thirsty I havnt drank anything in 2 days"

## 2022-12-10 NOTE — ED Notes (Signed)
ED TO INPATIENT HANDOFF REPORT  ED Nurse Name and Phone #: Marchelle Folks  [phone]  S Name/Age/Gender [name] 37 y.o. female Room/Bed: TRAAC/TRAAC  Code Status   Code Status: Full Code  Home/SNF/Other Home Patient oriented to: self, place, time, and situation Is this baseline? Yes   Triage Complete: Triage complete  Chief Complaint Pneumonia [J18.9]  Triage Note Pt coming in from home . Pt reports that she has been short of breath x3 days and has increasing weakness. Called ems today when she was unable to stand to get to the bathroom..   158/98 Hr 130 Rr 26    Allergies No Known Allergies  Level of Care/Admitting Diagnosis ED Disposition     ED Disposition  Admit   Condition  --   Comment  Hospital Area: [facility] [[id number]]  Level of Care: Telemetry Medical [104]  May admit patient to [facility] or [facility] if equivalent level of care is available:: Yes  Covid Evaluation: Asymptomatic - no recent exposure (last 10 days) testing not required  Diagnosis: Pneumonia [227785]  Admitting Physician: Lorin Glass [[id number]]  Attending Physician: Lorin Glass [[id number]]  Certification:: I certify this patient will need inpatient services for at least 2 midnights  Expected Medical Readiness: 12/13/2022          B Medical/Surgery History Past Medical History:  Diagnosis Date   Substance abuse [facility])    Past Surgical History:  Procedure Laterality Date   AMPUTATION Left 08/30/2016   Procedure: REVISION OF LEFT LONG FINGER AMPUTATION;  Surgeon: Mack Hook, MD;  Location: [facility] OR;  Service: Orthopedics;  Laterality: Left;   AMPUTATION Left 07/24/2022   Procedure: SMALL FINGER PARTIAL AMPUTATION;  Surgeon: Mack Hook, MD;  Location: [facility] OR;  Service: Orthopedics;  Laterality: Left;   CESAREAN SECTION     I & D EXTREMITY Left 07/21/2022   Procedure: Left small finger excision of skin and subcutaneous tissue, with prox-distal  irrigation;  Surgeon: Mack Hook, MD;  Location: [facility] OR;  Service: Orthopedics;  Laterality: Left;     A IV Location/Drains/Wounds Patient Lines/Drains/Airways Status     Active Line/Drains/Airways     Name Placement date Placement time Site Days   Peripheral IV 12/10/22 18 G Right Antecubital 12/10/22  0528  Antecubital  less than 1            Intake/Output Last 24 hours  Intake/Output Summary (Last 24 hours) at 12/10/2022 0819 Last data filed at 12/10/2022 0656 Gross per 24 hour  Intake 246.25 ml  Output --  Net 246.25 ml    Labs/Imaging Results for orders placed or performed during the hospital encounter of 12/10/22 (from the past 48 hour(s))  Comprehensive metabolic panel     Status: Abnormal   Collection Time: 12/10/22  4:59 AM  Result Value Ref Range   Sodium 137 135 - 145 mmol/L   Potassium 3.1 (L) 3.5 - 5.1 mmol/L   Chloride 101 98 - 111 mmol/L   CO2 23 22 - 32 mmol/L   Glucose, Bld 93 70 - 99 mg/dL    Comment: Glucose reference range applies only to samples taken after fasting for at least 8 hours.   BUN 7 6 - 20 mg/dL   Creatinine, Ser 1.02 0.44 - 1.00 mg/dL   Calcium 9.4 8.9 - 72.5 mg/dL   Total Protein 7.7 6.5 - 8.1 g/dL   Albumin 4.2 3.5 - 5.0 g/dL   AST 27 15 - 41 U/L  ALT 20 0 - 44 U/L   Alkaline Phosphatase 72 38 - 126 U/L   Total Bilirubin 0.5 0.3 - 1.2 mg/dL   GFR, Estimated >47 >82 mL/min    Comment: (NOTE) Calculated using the CKD-EPI Creatinine Equation (2021)    Anion gap 13 5 - 15    Comment: Performed at [facility], [address] [place], [place] [address]  CBC with Differential     Status: Abnormal   Collection Time: 12/10/22  4:59 AM  Result Value Ref Range   WBC 14.1 (H) 4.0 - 10.5 K/uL   RBC 4.86 3.87 - 5.11 MIL/uL   Hemoglobin 14.5 12.0 - 15.0 g/dL   HCT 30.8 65.7 - 84.6 %   MCV 89.1 80.0 - 100.0 fL   MCH 29.8 26.0 - 34.0 pg   MCHC 33.5 30.0 - 36.0 g/dL   RDW 96.2 95.2 - 84.1 %   Platelets 427 (H) 150 - 400  K/uL   nRBC 0.0 0.0 - 0.2 %   Neutrophils Relative % 85 %   Neutro Abs 12.0 (H) 1.7 - 7.7 K/uL   Lymphocytes Relative 7 %   Lymphs Abs 1.0 0.7 - 4.0 K/uL   Monocytes Relative 8 %   Monocytes Absolute 1.1 (H) 0.1 - 1.0 K/uL   Eosinophils Relative 0 %   Eosinophils Absolute 0.0 0.0 - 0.5 K/uL   Basophils Relative 0 %   Basophils Absolute 0.1 0.0 - 0.1 K/uL   Immature Granulocytes 0 %   Abs Immature Granulocytes 0.04 0.00 - 0.07 K/uL    Comment: Performed at [facility], [address] [place], [place] [address]  Protime-INR     Status: None   Collection Time: 12/10/22  4:59 AM  Result Value Ref Range   Prothrombin Time 12.8 11.4 - 15.2 seconds   INR 0.9 0.8 - 1.2    Comment: (NOTE) INR goal varies based on device and disease states. Performed at [facility], [address] [place], [place] [address]   hCG, serum, qualitative     Status: None   Collection Time: 12/10/22  4:59 AM  Result Value Ref Range   Preg, Serum NEGATIVE NEGATIVE    Comment:        THE SENSITIVITY OF THIS METHODOLOGY IS >10 mIU/mL. Performed at [facility], [address], [place], [place] [address]   I-Stat Lactic Acid, ED     Status: None   Collection Time: 12/10/22  5:04 AM  Result Value Ref Range   Lactic Acid, Venous 1.4 0.5 - 1.9 mmol/L  Resp panel by RT-PCR (RSV, Flu A&B, Covid) Anterior Nasal Swab     Status: None   Collection Time: 12/10/22  5:33 AM   Specimen: Anterior Nasal Swab  Result Value Ref Range   SARS Coronavirus 2 by RT PCR NEGATIVE NEGATIVE   Influenza A by PCR NEGATIVE NEGATIVE   Influenza B by PCR NEGATIVE NEGATIVE    Comment: (NOTE) The Xpert Xpress SARS-CoV-2/FLU/RSV plus assay is intended as an aid in the diagnosis of influenza from Nasopharyngeal swab specimens and should not be used as a sole basis for treatment. Nasal washings and aspirates are unacceptable for Xpert Xpress SARS-CoV-2/FLU/RSV testing.  Fact Sheet for  Patients: BloggerCourse.com  Fact Sheet for Healthcare Providers: SeriousBroker.it  This test is not yet approved or cleared by the [place] FDA and has been authorized for detection and/or diagnosis of SARS-CoV-2 by FDA under an Emergency  Use Authorization (EUA). This EUA will remain in effect (meaning this test can be used) for the duration of the COVID-19 declaration under Section 564(b)(1) of the Act, 21 U.S.C. section 360bbb-3(b)(1), unless the authorization is terminated or revoked.     Resp Syncytial Virus by PCR NEGATIVE NEGATIVE    Comment: (NOTE) Fact Sheet for Patients: BloggerCourse.com  Fact Sheet for Healthcare Providers: SeriousBroker.it  This test is not yet approved or cleared by the [place] FDA and has been authorized for detection and/or diagnosis of SARS-CoV-2 by FDA under an Emergency Use Authorization (EUA). This EUA will remain in effect (meaning this test can be used) for the duration of the COVID-19 declaration under Section 564(b)(1) of the Act, 21 U.S.C. section 360bbb-3(b)(1), unless the authorization is terminated or revoked.  Performed at [facility], [address] [place], [place] [address]    DG Chest 2 View  Result Date: 12/10/2022 CLINICAL DATA:  Suspected sepsis EXAM: CHEST - 2 VIEW COMPARISON:  07/24/2022 FINDINGS: Airspace disease at the right more than left base, partially obscuring the right heart border. Normal heart size and mediastinal contours. No effusion or pneumothorax. Artifact from EKG leads. IMPRESSION: Pneumonia at the lung bases, most extensive in the right middle lobe. Electronically Signed   By: Tiburcio Pea M.D.   On: 12/10/2022 06:07    Pending Labs Unresulted Labs (From admission, onward)     Start     Ordered   12/11/22 0500  Basic metabolic panel  Tomorrow morning,   R        12/10/22 0805    12/11/22 0500  CBC  Tomorrow morning,   R        12/10/22 0805   12/10/22 0802  Rapid urine drug screen (hospital performed)  Once,   STAT        12/10/22 0801   12/10/22 0430  Culture, blood (Routine x 2)  BLOOD CULTURE X 2,   R (with STAT occurrences)      12/10/22 0430   12/10/22 0430  Urinalysis, w/ Reflex to Culture (Infection Suspected) -Urine, Clean Catch  Once,   URGENT       Question:  Specimen Source  Answer:  Urine, Clean Catch   12/10/22 0430            Vitals/Pain Today's Vitals   12/10/22 0600 12/10/22 0630 12/10/22 0700 12/10/22 0800  BP: (!) 133/93 126/80 118/79 107/64  Pulse: (!) 135 (!) 131 (!) 129 (!) 121  Resp: (!) 43 (!) 26 (!) 34 (!) 24  Temp:      TempSrc:      SpO2: 98% 96% 96% 97%  Weight:      Height:        Isolation Precautions No active isolations  Medications Medications  acetaminophen (TYLENOL) tablet 650 mg (has no administration in time range)    Or  acetaminophen (TYLENOL) suppository 650 mg (has no administration in time range)  albuterol (PROVENTIL) (2.5 MG/3ML) 0.083% nebulizer solution 2.5 mg (has no administration in time range)  hydrALAZINE (APRESOLINE) injection 10 mg (has no administration in time range)  enoxaparin (LOVENOX) injection 40 mg (has no administration in time range)  cefTRIAXone (ROCEPHIN) 2 g in sodium chloride 0.9 % 100 mL IVPB (0 g Intravenous Stopped 12/10/22 0611)  azithromycin (ZITHROMAX) 500 mg in sodium chloride 0.9 % 250 mL IVPB (0 mg Intravenous Stopped 12/10/22 0656)  potassium chloride SA (KLOR-CON M) CR tablet 40 mEq (40 mEq Oral Given  12/10/22 0709)    Mobility walks     Focused Assessments Pulmonary Assessment Handoff:  Lung sounds:          R Recommendations: See Admitting Provider Note  Report given to:   Additional Notes: pt is ambulatory at this time. Pt has been tachypnic and tachycardic in the ed.

## 2022-12-10 NOTE — ED Provider Notes (Signed)
[place] [facility] AT [facility] Provider Note   CSN: [id number] Arrival date & time: 12/10/22  8413     History  Chief Complaint  Patient presents with   Shortness of Breath    [name] is a 37 y.o. female.  The history is provided by the patient.  Shortness of Breath She has a history of polysubstance abuse and comes in with 2-day history of cough productive of green sputum with associated dyspnea.  She describes pleuritic pain on the right side.  She has had subjective fever with associated chills and sweats as well as posttussive emesis.  She denies any sick contacts.  She does admit to smoking half pack of cigarettes a day.  Also, she is homeless.   Home Medications Prior to Admission medications   Medication Sig Start Date End Date Taking? Authorizing Provider  acetaminophen (TYLENOL) 500 MG tablet Take 2 tablets (1,000 mg total) by mouth every 6 (six) hours as needed. 10/15/19   Dartha Lodge, PA-C  ibuprofen (ADVIL) 200 MG tablet Take 800 mg by mouth every 6 (six) hours as needed for mild pain.    [provider]  oxyCODONE (OXY IR/ROXICODONE) 5 MG immediate release tablet Take 1 tablet (5 mg total) by mouth every 6 (six) hours as needed for moderate pain. 07/27/22   Jerald Kief, MD  pantoprazole (PROTONIX) 40 MG tablet Take 1 tablet (40 mg total) by mouth daily. 07/28/22 08/27/22  Jerald Kief, MD      Allergies    Patient has no known allergies.    Review of Systems   Review of Systems  Respiratory:  Positive for shortness of breath.   All other systems reviewed and are negative.   Physical Exam Updated Vital Signs BP (!) 144/92   Pulse (!) 124   Temp 98.7 F (37.1 C) (Oral)   Resp (!) 37   Ht 4\' 11"  (1.499 m)   Wt 50.8 kg   SpO2 97%   BMI 22.62 kg/m  Physical Exam Vitals and nursing note reviewed.   37 year old female, resting comfortably and in no acute distress. Vital signs are significant for elevated  heart rate, respiratory rate, borderline elevated blood pressure. Oxygen saturation is 97%, which is normal. Head is normocephalic and atraumatic. PERRLA, EOMI. Oropharynx is clear. Neck is nontender and supple without adenopathy. Lungs are clear without rales, wheezes, or rhonchi. Chest is nontender. Heart has regular rate and rhythm without murmur. Abdomen is soft, flat, nontender. Extremities have no cyanosis or edema, full range of motion is present. Skin is warm and dry without rash. Neurologic: Mental status is normal, cranial nerves are intact, moves all extremities equally.  ED Results / Procedures / Treatments   Labs (all labs ordered are listed, but only abnormal results are displayed) Labs Reviewed  COMPREHENSIVE METABOLIC PANEL - Abnormal; Notable for the following components:      Result Value   Potassium 3.1 (*)    All other components within normal limits  CBC WITH DIFFERENTIAL/PLATELET - Abnormal; Notable for the following components:   WBC 14.1 (*)    Platelets 427 (*)    Neutro Abs 12.0 (*)    Monocytes Absolute 1.1 (*)    All other components within normal limits  RESP PANEL BY RT-PCR (RSV, FLU A&B, COVID)  RVPGX2  CULTURE, BLOOD (ROUTINE X 2)  CULTURE, BLOOD (ROUTINE X 2)  PROTIME-INR  HCG, SERUM, QUALITATIVE  URINALYSIS, W/ REFLEX TO CULTURE (  INFECTION SUSPECTED)  I-STAT CG4 LACTIC ACID, ED    EKG EKG Interpretation Date/Time:  Tuesday December 10 2022 04:28:58 EDT Ventricular Rate:  125 PR Interval:  103 QRS Duration:  76 QT Interval:  334 QTC Calculation: 482 R Axis:   84  Text Interpretation: Sinus tachycardia Nonspecific T abnormalities, lateral leads Artifact in lead(s) I II III aVR aVL aVF V1 V2 V6 When compared with ECG of 04/29/2021, HEART RATE has increased Confirmed by Dione Booze ([id number]) on 12/10/2022 5:17:08 AM  Radiology DG Chest 2 View  Result Date: 12/10/2022 CLINICAL DATA:  Suspected sepsis EXAM: CHEST - 2 VIEW COMPARISON:   07/24/2022 FINDINGS: Airspace disease at the right more than left base, partially obscuring the right heart border. Normal heart size and mediastinal contours. No effusion or pneumothorax. Artifact from EKG leads. IMPRESSION: Pneumonia at the lung bases, most extensive in the right middle lobe. Electronically Signed   By: Tiburcio Pea M.D.   On: 12/10/2022 06:07    Procedures Procedures  Cardiac monitor shows sinus tachycardia, per my interpretation.  Medications Ordered in ED Medications  cefTRIAXone (ROCEPHIN) 2 g in sodium chloride 0.9 % 100 mL IVPB (0 g Intravenous Stopped 12/10/22 0611)  azithromycin (ZITHROMAX) 500 mg in sodium chloride 0.9 % 250 mL IVPB (0 mg Intravenous Stopped 12/10/22 0656)  potassium chloride SA (KLOR-CON M) CR tablet 40 mEq (40 mEq Oral Given 12/10/22 0709)    ED Course/ Medical Decision Making/ A&P                                 Medical Decision Making Amount and/or Complexity of Data Reviewed Labs: ordered. Radiology: ordered.  Risk Prescription drug management.   Cough with subjective fever and dyspnea as well as tachycardia concerning for pneumonia.  Patient is nontoxic in appearance and temperature is normal.  I have reviewed her past records, and on 10/14/2019 she had multifocal pneumonia secondary to COVID-19 but was treated as an outpatient.  I have reviewed her electrocardiogram, and my interpretation is sinus tachycardia, nonspecific T wave abnormalities which are unchanged from prior.  I have reviewed her laboratory test, and my interpretation is mild leukocytosis which is nonspecific, mild hypokalemia and I have ordered oral potassium, normal lactic acid level, negative respiratory pathogen panel.  Chest x-ray shows pneumonia involving the right middle and lower lobe.  I have independently viewed the images, and agree with radiologist's interpretation.  I have ordered antibiotics for community-acquired pneumonia-ceftriaxone and azithromycin.   Although she is tachycardic, she does not meet sepsis criteria with normal temperature and normal lactic acid level.  I have discussed case with Dr. Pola Corn of [facility], who agrees to admit the patient.  Final Clinical Impression(s) / ED Diagnoses Final diagnoses:  Community acquired pneumonia, unspecified laterality  Hypokalemia  Homeless    Rx / DC Orders ED Discharge Orders     None         Dione Booze, MD 12/10/22 [phone]

## 2022-12-10 NOTE — H&P (Signed)
[facility] History and Physical  [name] ZOX:[id number] DOB: [birth year] DOA: 12/10/2022 PCP: [facility], User, MD (Inactive)  Presented from: Homeless Chief Complaint: Shortness of breath  History of Present Illness: [name] is a 37 y.o. female currently homeless, with history of polysubstance use (tobacco, alcohol, cocaine).  Patient resented to the ED earlier this morning with complaint of shortness of breath and progressive worsening weakness for 3 days.  Also reported associated cough with productive green sputum and pleuritic chest pain on the right.  She mentioned subjective fever, chills, sweats and vomiting. Smokes half pack of cigarettes a day Drinks few beers occasionally, last use was yesterday Uses cocaine occasionally  Earlier in the ED, she was afebrile, tachycardic to 120s, blood pressure in normal range, breathing on room air Initial labs with WC count 14.1, lactic acid 1.4, potassium 3.1 Respiratory virus panel unremarkable Chest x-ray with bibasilar pneumonia, right more than left. EKG with sinus tachycardia at 125 bpm, QTc 482 ms  Blood culture collected.  Patient was started on IV Rocephin and IV azithromycin.  At the time of my evaluation, patient was somnolent, and only able to utter few words and hence I had to rely on previous reports for HPI. She also mounted a fever of 101.1 which she did not have earlier.  Review of Systems:  All systems were reviewed and were negative unless otherwise mentioned in the HPI   Past medical history: Past Medical History:  Diagnosis Date   Substance abuse [facility])     Past surgical history: Past Surgical History:  Procedure Laterality Date   AMPUTATION Left 08/30/2016   Procedure: REVISION OF LEFT LONG FINGER AMPUTATION;  Surgeon: Mack Hook, MD;  Location: [facility] OR;  Service: Orthopedics;  Laterality: Left;   AMPUTATION Left 07/24/2022   Procedure: SMALL FINGER PARTIAL AMPUTATION;  Surgeon:  Mack Hook, MD;  Location: [facility] OR;  Service: Orthopedics;  Laterality: Left;   CESAREAN SECTION     I & D EXTREMITY Left 07/21/2022   Procedure: Left small finger excision of skin and subcutaneous tissue, with prox-distal irrigation;  Surgeon: Mack Hook, MD;  Location: [facility] OR;  Service: Orthopedics;  Laterality: Left;    Social History:  reports that she has been smoking cigarettes. She has never used smokeless tobacco. She reports current alcohol use. She reports current drug use. Drug: Cocaine.  Allergies:  No Known Allergies Patient has no known allergies.   Family history:  Family history not pertinent to current presentation  Physical Exam: Vitals:   12/10/22 0700 12/10/22 0800 12/10/22 0838 12/10/22 0905  BP: 118/79 107/64  114/78  Pulse: (!) 129 (!) 121    Resp: (!) 34 (!) 24  19  Temp:   (!) 101.1 F (38.4 C) 98.4 F (36.9 C)  TempSrc:    Oral  SpO2: 96% 97%  98%  Weight:      Height:       Wt Readings from Last 3 Encounters:  12/10/22 50.8 kg  07/21/22 56.7 kg  04/29/21 54.4 kg   Body mass index is 22.62 kg/m.  General exam: Young African-American female.  Somnolent. Skin: No rashes, lesions or ulcers. HEENT: Atraumatic, normocephalic, no obvious bleeding Lungs: Clear to auscultation bilaterally.  Coughs on deep breathing.  Patient was holding on the right side of her chest while coughing, likely due to pleuritic chest pain CVS: Sinus tachycardia, regular rhythm, no murmur GI/Abd soft, nontender, nondistended, bowel sound present CNS: Somnolent, open eyes on verbal command for  brief moment.  Only able to order a few words Psychiatry: Unable to examine at this time Extremities: No pedal edema, no calf tenderness   ------------------------------------------------------------------------------------------------------ Assessment/Plan: Principal Problem:   Pneumonia  Severe sepsis POA  Bibasilar pneumonia Young homeless female presented with  shortness of breath, cough, chills Initially did not have fever but now have mounted fever of 101 with tachycardia, leukocytosis and altered mental status Chest x-ray with bibasilar pneumonia Blood culture sent. Started on IV Rocephin and azithromycin.  I will continue the same, QTc 482 ms Mucinex, as needed bronchodilators, incentive spirometry Also obtain procalcitonin level, strep pneumoniae, Legionella urine antigen and mycoplasma antibody and HIV screening tests Recent Labs  Lab 12/10/22 0459 12/10/22 0504  WBC 14.1*  --   LATICACIDVEN  --  1.4   Acute metabolic encephalopathy Significantly altered at the time of my evaluation.   I did not see any sedative given earlier in the ED.  Altered mental status likely due to sepsis.  Also obtain UDS Continue to monitor mental status change  Hypokalemia Potassium level was low. Replacement was given earlier. Continue to monitor. Recent Labs  Lab 12/10/22 0459  K 3.1*   Polysubstance abuse (tobacco, alcohol, cocaine) Watch out for alcohol withdrawal symptoms When awake, needs counseling to quit  Mobility: Encourage ambulation when awake  Goals of care   Code Status: Full Code    DVT prophylaxis:  enoxaparin (LOVENOX) injection 40 mg Start: 12/10/22 1000   Antimicrobials: IV Rocephin, azithromycin Fluid: Start normal saline at 125 mL/h Consultants: None Family Communication: None at bedside  Dispo: The patient is from: Homeless              Anticipated d/c is to: Shelter likely  Diet: Diet Order             Diet regular Room service appropriate? Yes; Fluid consistency: Thin  Diet effective now                    ------------------------------------------------------------------------------------- Severity of Illness: The appropriate patient status for this patient is INPATIENT. Inpatient status is judged to be reasonable and necessary in order to provide the required intensity of service to ensure the patient's  safety. The patient's presenting symptoms, physical exam findings, and initial radiographic and laboratory data in the context of their chronic comorbidities is felt to place them at high risk for further clinical deterioration. Furthermore, it is not anticipated that the patient will be medically stable for discharge from the hospital within 2 midnights of admission.   * I certify that at the point of admission it is my clinical judgment that the patient will require inpatient hospital care spanning beyond 2 midnights from the point of admission due to high intensity of service, high risk for further deterioration and high frequency of surveillance required.* -------------------------------------------------------------------------------------    Home Meds: Prior to Admission medications   Not on File    Labs on Admission:   CBC: Recent Labs  Lab 12/10/22 0459  WBC 14.1*  NEUTROABS 12.0*  HGB 14.5  HCT 43.3  MCV 89.1  PLT 427*    Basic Metabolic Panel: Recent Labs  Lab 12/10/22 0459  NA 137  K 3.1*  CL 101  CO2 23  GLUCOSE 93  BUN 7  CREATININE 0.83  CALCIUM 9.4    Liver Function Tests: Recent Labs  Lab 12/10/22 0459  AST 27  ALT 20  ALKPHOS 72  BILITOT 0.5  PROT 7.7  ALBUMIN 4.2  No results for input(s): "LIPASE", "AMYLASE" in the last 168 hours. No results for input(s): "AMMONIA" in the last 168 hours.  Cardiac Enzymes: No results for input(s): "CKTOTAL", "CKMB", "CKMBINDEX", "TROPONINI" in the last 168 hours.  BNP (last 3 results) No results for input(s): "BNP" in the last 8760 hours.  ProBNP (last 3 results) No results for input(s): "PROBNP" in the last 8760 hours.  CBG: No results for input(s): "GLUCAP" in the last 168 hours.  Lipase     Component Value Date/Time   LIPASE 65 (H) 09/05/2020 2216     Urinalysis    Component Value Date/Time   COLORURINE YELLOW 07/24/2022 1155   APPEARANCEUR CLEAR 07/24/2022 1155   LABSPEC 1.012  07/24/2022 1155   PHURINE 6.0 07/24/2022 1155   GLUCOSEU NEGATIVE 07/24/2022 1155   HGBUR NEGATIVE 07/24/2022 1155   BILIRUBINUR NEGATIVE 07/24/2022 1155   KETONESUR NEGATIVE 07/24/2022 1155   PROTEINUR NEGATIVE 07/24/2022 1155   UROBILINOGEN 0.2 10/26/2008 1351   NITRITE NEGATIVE 07/24/2022 1155   LEUKOCYTESUR NEGATIVE 07/24/2022 1155     Drugs of Abuse     Component Value Date/Time   LABOPIA POSITIVE (A) 07/20/2022 1042   COCAINSCRNUR POSITIVE (A) 07/20/2022 1042   LABBENZ NONE DETECTED 07/20/2022 1042   AMPHETMU NONE DETECTED 07/20/2022 1042   THCU POSITIVE (A) 07/20/2022 1042   LABBARB NONE DETECTED 07/20/2022 1042      Radiological Exams on Admission: DG Chest 2 View  Result Date: 12/10/2022 CLINICAL DATA:  Suspected sepsis EXAM: CHEST - 2 VIEW COMPARISON:  07/24/2022 FINDINGS: Airspace disease at the right more than left base, partially obscuring the right heart border. Normal heart size and mediastinal contours. No effusion or pneumothorax. Artifact from EKG leads. IMPRESSION: Pneumonia at the lung bases, most extensive in the right middle lobe. Electronically Signed   By: Tiburcio Pea M.D.   On: 12/10/2022 06:07     Signed, Lorin Glass, MD [facility] 12/10/2022

## 2022-12-11 DIAGNOSIS — R6520 Severe sepsis without septic shock: Secondary | ICD-10-CM

## 2022-12-11 DIAGNOSIS — A419 Sepsis, unspecified organism: Secondary | ICD-10-CM

## 2022-12-11 LAB — LEGIONELLA PNEUMOPHILA SEROGP 1 UR AG: L. pneumophila Serogp 1 Ur Ag: NEGATIVE

## 2022-12-11 LAB — BASIC METABOLIC PANEL
Anion gap: 11 (ref 5–15)
BUN: 10 mg/dL (ref 6–20)
CO2: 23 mmol/L (ref 22–32)
Calcium: 8.8 mg/dL — ABNORMAL LOW (ref 8.9–10.3)
Chloride: 103 mmol/L (ref 98–111)
Creatinine, Ser: 0.82 mg/dL (ref 0.44–1.00)
GFR, Estimated: >60 mL/min (ref >60)
Glucose, Bld: 122 mg/dL — ABNORMAL HIGH (ref 70–99)
Potassium: 3.5 mmol/L (ref 3.5–5.1)
Sodium: 137 mmol/L (ref 135–145)

## 2022-12-11 LAB — CBC
HCT: 36.8 % (ref 36.0–46.0)
Hemoglobin: 12.4 g/dL (ref 12.0–15.0)
MCH: 30.2 pg (ref 26.0–34.0)
MCHC: 33.7 g/dL (ref 30.0–36.0)
MCV: 89.8 fL (ref 80.0–100.0)
Platelets: 315 10*3/uL (ref 150–400)
RBC: 4.1 MIL/uL (ref 3.87–5.11)
RDW: 13.1 % (ref 11.5–15.5)
WBC: 20.9 10*3/uL — ABNORMAL HIGH (ref 4.0–10.5)
nRBC: 0 % (ref 0.0–0.2)

## 2022-12-11 LAB — HIV ANTIBODY (ROUTINE TESTING W REFLEX): HIV Screen 4th Generation wRfx: NONREACTIVE

## 2022-12-11 LAB — MRSA NEXT GEN BY PCR, NASAL: MRSA by PCR Next Gen: DETECTED — AB

## 2022-12-11 LAB — MYCOPLASMA PNEUMONIAE ANTIBODY, IGM: Mycoplasma pneumo IgM: <770 U/mL (ref 0–769)

## 2022-12-11 LAB — LACTIC ACID, PLASMA: Lactic Acid, Venous: 0.8 mmol/L (ref 0.5–1.9)

## 2022-12-11 MED ORDER — AZITHROMYCIN 500 MG PO TABS
500.0000 mg | ORAL_TABLET | Freq: Every day | ORAL | Status: DC
  Administered 2022-12-12: 500 mg via ORAL
  Filled 2022-12-11: qty 1

## 2022-12-11 MED ORDER — MUPIROCIN 2 % EX OINT
1.0000 | TOPICAL_OINTMENT | Freq: Two times a day (BID) | CUTANEOUS | Status: DC
  Administered 2022-12-11 – 2022-12-12 (×3): 1 via NASAL
  Filled 2022-12-11: qty 22

## 2022-12-11 MED ORDER — CHLORHEXIDINE GLUCONATE CLOTH 2 % EX PADS
6.0000 | MEDICATED_PAD | Freq: Every day | CUTANEOUS | Status: DC
  Administered 2022-12-12: 6 via TOPICAL

## 2022-12-11 NOTE — Progress Notes (Signed)
PHARMACIST - PHYSICIAN COMMUNICATION DR:   Pola Corn CONCERNING: Antibiotic IV to Oral Route Change Policy  RECOMMENDATION: This patient is receiving azithromycin by the intravenous route.  Based on criteria approved by the Pharmacy and Therapeutics Committee, the antibiotic(s) is/are being converted to the equivalent oral dose form(s).   DESCRIPTION: These criteria include: Patient being treated for a respiratory tract infection, urinary tract infection, cellulitis or clostridium difficile associated diarrhea if on metronidazole The patient is not neutropenic and does not exhibit a GI malabsorption state The patient is eating (either orally or via tube) and/or has been taking other orally administered medications for a least 24 hours The patient is improving clinically and has a Tmax < 100.5  If you have questions about this conversion, please contact the Pharmacy Department  []   [phone] )  [facility] []   [phone] )  [facility] [x]   [phone] )  [facility] []   [phone] )  [facility] []   [phone] )  [facility]

## 2022-12-11 NOTE — Plan of Care (Signed)
Problem: Education: Goal: Knowledge of General Education information will improve Description Including pain rating scale, medication(s)/side effects and non-pharmacologic comfort measures Outcome: Progressing   Problem: Health Behavior/Discharge Planning: Goal: Ability to manage health-related needs will improve Outcome: Progressing   Problem: Clinical Measurements: Goal: Respiratory complications will improve Outcome: Progressing   Problem: Nutrition: Goal: Adequate nutrition will be maintained Outcome: Progressing   Problem: Coping: Goal: Level of anxiety will decrease Outcome: Progressing   Problem: Safety: Goal: Ability to remain free from injury will improve Outcome: Progressing

## 2022-12-11 NOTE — Plan of Care (Signed)

## 2022-12-11 NOTE — Progress Notes (Signed)
PROGRESS NOTE  [name]  DOB: [birth year]  PCP: [facility], User, MD (Inactive) NWG:[id number]  DOA: 12/10/2022  LOS: 1 day  Hospital Day: 2  Brief narrative: [name] is a 37 y.o. female currently homeless, with history of polysubstance use (tobacco, alcohol, cocaine). 10/15, patient presented to the ED with complaint of shortness of breath and progressive worsening weakness for 3 days.  Also reported associated cough with productive green sputum and pleuritic chest pain on the right.  She mentioned subjective fever, chills, sweats and vomiting. Smokes half pack of cigarettes a day Drinks few beers occasionally, last use was yesterday Uses cocaine occasionally  Earlier in the ED, she was afebrile later mounted fever of 101.1. Tachycardic to 120s, blood pressure in normal range, breathing on room air Initial labs with WC count 14.1, lactic acid 1.4, potassium 3.1 Respiratory virus panel unremarkable Chest x-ray with bibasilar pneumonia, right more than left. EKG with sinus tachycardia at 125 bpm, QTc 482 ms Blood culture collected.   Patient was started on IV Rocephin and IV azithromycin.  Subjective: Patient was seen and examined this morning.  Young African-American female.  Lying on bed.  Waking up.  Able to answer questions today. Tachycardia improving.  Last fever was last night at 8 PM - 100.6.  Assessment/Plan: Severe sepsis POA  Bibasilar pneumonia Young homeless female presented with shortness of breath, cough, chills In the ED, patient had fever of 101, tachycardia, leukocytosis and altered mental status Chest x-ray with bibasilar pneumonia Blood culture sent. Currently on IV Rocephin and azithromycin.  Continue Mucinex, as needed bronchodilators, incentive spirometry Pending strep pneumoniae, Legionella urine antigen and mycoplasma antibody and HIV screening tests Last fever spike was last night  - 100.6.  WBC count worse today. Continue to  monitor Recent Labs  Lab 12/10/22 0457 12/10/22 0459 12/10/22 0504 12/11/22 0510  WBC  --  14.1*  --  20.9*  LATICACIDVEN  --   --  1.4 0.8  PROCALCITON <0.10  --   --   --    Acute metabolic encephalopathy Patient was significantly altered at the time of my evaluation.   Likely because of sepsis as well as drugs. Mental status gradually improving.  Hypokalemia Potassium level was low. Replacement was given earlier. Continue to monitor. Recent Labs  Lab 12/10/22 0459 12/11/22 0510  K 3.1* 3.5   Polysubstance abuse (tobacco, alcohol, cocaine) UDS positive for cocaine and THC.  Watch out for alcohol withdrawal symptoms.  Continue CIWA protocol Counseled to quit.  Mobility: Encourage ambulation when awake  Goals of care   Code Status: Full Code    DVT prophylaxis:  enoxaparin (LOVENOX) injection 40 mg Start: 12/10/22 1000   Antimicrobials: IV Rocephin, azithromycin Fluid: Adequately hydrated.  Stop IV fluid today. Consultants: None Family Communication: None at bedside  Status: Inpatient Level of care:  Telemetry Medical   Patient is from: Homeless shelter Needs to continue in-hospital care: Continue to monitor for fever Anticipated d/c to: Hopefully discharge to shelter tomorrow   Diet:  Diet Order             Diet regular Room service appropriate? Yes; Fluid consistency: Thin  Diet effective now                   Scheduled Meds:  [START ON 12/12/2022] azithromycin  500 mg Oral QAC breakfast   [START ON 12/12/2022] Chlorhexidine Gluconate Cloth  6 each Topical Q0600   enoxaparin (LOVENOX) injection  40 mg  Subcutaneous Q24H   folic acid  1 mg Oral Daily   guaiFENesin  600 mg Oral BID   multivitamin with minerals  1 tablet Oral Daily   mupirocin ointment  1 Application Nasal BID   thiamine  100 mg Oral Daily   Or   thiamine  100 mg Intravenous Daily    PRN meds: acetaminophen **OR** acetaminophen, albuterol, hydrALAZINE, LORazepam **OR**  LORazepam   Infusions:   cefTRIAXone (ROCEPHIN)  IV 2 g (12/11/22 6440)    Antimicrobials: Anti-infectives (From admission, onward)    Start     Dose/Rate Route Frequency Ordered Stop   12/12/22 0630  azithromycin (ZITHROMAX) tablet 500 mg        500 mg Oral Daily before breakfast 12/11/22 0750     12/11/22 0600  cefTRIAXone (ROCEPHIN) 2 g in sodium chloride 0.9 % 100 mL IVPB        2 g 200 mL/hr over 30 Minutes Intravenous Every 24 hours 12/10/22 0918     12/11/22 0600  azithromycin (ZITHROMAX) 500 mg in sodium chloride 0.9 % 250 mL IVPB  Status:  Discontinued        500 mg 250 mL/hr over 60 Minutes Intravenous Every 24 hours 12/10/22 0918 12/11/22 0750   12/10/22 0530  cefTRIAXone (ROCEPHIN) 2 g in sodium chloride 0.9 % 100 mL IVPB        2 g 200 mL/hr over 30 Minutes Intravenous  Once 12/10/22 0517 12/10/22 0611   12/10/22 0530  azithromycin (ZITHROMAX) 500 mg in sodium chloride 0.9 % 250 mL IVPB        500 mg 250 mL/hr over 60 Minutes Intravenous  Once 12/10/22 0517 12/10/22 0656       Objective: Vitals:   12/11/22 0747 12/11/22 1203  BP: (!) 133/106 126/81  Pulse: 95 (!) 108  Resp: 18   Temp: 98.4 F (36.9 C) 97.7 F (36.5 C)  SpO2: 98% 96%    Intake/Output Summary (Last 24 hours) at 12/11/2022 1516 Last data filed at 12/11/2022 0546 Gross per 24 hour  Intake 1306.9 ml  Output --  Net 1306.9 ml   Filed Weights   12/10/22 0424  Weight: 50.8 kg   Weight change:  Body mass index is 22.62 kg/m.   Physical Exam:  General exam: Young African-American female.  Not in pain. Skin: No rashes, lesions or ulcers. HEENT: Atraumatic, normocephalic, no obvious bleeding Lungs: Clear to auscultation bilaterally.  Coughs on deep breathing.  Right-sided chest pain improving.   CVS: Sinus tachycardia, regular rhythm, no murmur GI/Abd soft, nontender, nondistended, bowel sound present CNS: Alert, awake, oriented x 3 Psychiatry: Sad affect Extremities: No pedal edema,  no calf tenderness  Data Review: I have personally reviewed the laboratory data and studies available.  F/u labs  Unresulted Labs (From admission, onward)     Start     Ordered   12/10/22 1200  Mycoplasma pneumoniae antibody, IgM  Once,   R        12/10/22 1200   12/10/22 0923  Expectorated Sputum Assessment w Gram Stain, Rflx to Resp Cult  Once,   R        12/10/22 0923   12/10/22 0923  Legionella Pneumophila Serogp 1 Ur Ag  Once,   R        12/10/22 0923            Total time spent in review of labs and imaging, patient evaluation, formulation of plan, documentation and communication with family:  45 minutes  Signed, Lorin Glass, MD [facility] 12/11/2022

## 2022-12-12 ENCOUNTER — Other Ambulatory Visit (HOSPITAL_COMMUNITY): Payer: Self-pay

## 2022-12-12 MED ORDER — GUAIFENESIN ER 600 MG PO TB12: 600.0000 mg | ORAL_TABLET | Freq: Two times a day (BID) | ORAL | 0 refills | Status: DC

## 2022-12-12 MED ORDER — AMOXICILLIN-POT CLAVULANATE 875-125 MG PO TABS
1.0000 | ORAL_TABLET | Freq: Two times a day (BID) | ORAL | 0 refills | Status: AC
Start: 2022-12-12 — End: 2022-12-17
  Filled 2022-12-12: qty 10, 5d supply, fill #0

## 2022-12-12 MED ORDER — AMOXICILLIN-POT CLAVULANATE 875-125 MG PO TABS
1.0000 | ORAL_TABLET | Freq: Two times a day (BID) | ORAL | Status: DC
  Administered 2022-12-12: 1 via ORAL
  Filled 2022-12-12: qty 1

## 2022-12-12 MED ORDER — GUAIFENESIN ER 600 MG PO TB12
600.0000 mg | ORAL_TABLET | Freq: Two times a day (BID) | ORAL | 0 refills | Status: AC
  Filled 2022-12-12: qty 14, 7d supply, fill #0

## 2022-12-12 MED ORDER — AMOXICILLIN-POT CLAVULANATE 875-125 MG PO TABS: 1.0000 | ORAL_TABLET | Freq: Two times a day (BID) | ORAL | 0 refills | Status: DC

## 2022-12-12 NOTE — Progress Notes (Signed)
Transition of Care [facility]) - Inpatient Brief Assessment   Patient Details  Name: FELIZA DIVEN MRN: [id number] Date of Birth: 05/18/1985  Transition of Care [facility]) CM/SW Contact:    Janae Bridgeman, RN Phone Number: 12/12/2022, 12:22 PM   Clinical Narrative: CM met with the patient at the bedside to discuss TOC needs.  Patient is living on the streets.  I provided clothing for the patient and resources included in the AVs since patient has active use of ETOH and cocaine.  PCP follow up with [facility].  Patient has a cell phone and will call to make the appointment.  Patient was given 4 bus tickets for transportation for appointments for hospital follow up.  Meds will be provided through [facility] pharmacy and then discharge back to community.   Transition of Care Asessment: Insurance and Status: (P) Insurance coverage has been reviewed Patient has primary care physician: (P) No (F/U placed for [employer] [facility]) Home environment has been reviewed: (P) HOmeless - living on the streets Prior level of function:: (P) Independent Prior/Current Home Services: (P) No current home services Social Determinants of Health Reivew: (P) SDOH reviewed interventions complete Readmission risk has been reviewed: (P) Yes Transition of care needs: (P) transition of care needs identified, TOC will continue to follow

## 2022-12-12 NOTE — Discharge Summary (Signed)
Physician Discharge Summary  [name]:[id number] DOB: 1985/07/18 DOA: 12/10/2022  PCP: Centricity, User, MD (Inactive)  Admit date: 12/10/2022 Discharge date: 12/12/2022  Admitted From: Homeless Discharge disposition: Back to homelessness  Recommendations at discharge:  Complete the course of antibiotics  Brief narrative: [name] is a 37 y.o. female currently homeless, with history of polysubstance use (tobacco, alcohol, cocaine). 10/15, patient presented to the ED with complaint of shortness of breath and progressive worsening weakness for 3 days.  Also reported associated cough with productive green sputum and pleuritic chest pain on the right.  She mentioned subjective fever, chills, sweats and vomiting. Smokes half pack of cigarettes a day Drinks few beers occasionally, last use was yesterday Uses cocaine occasionally  Earlier in the ED, she was afebrile later mounted fever of 101.1. Tachycardic to 120s, blood pressure in normal range, breathing on room air Initial labs with WC count 14.1, lactic acid 1.4, potassium 3.1 Respiratory virus panel unremarkable Chest x-ray with bibasilar pneumonia, right more than left. EKG with sinus tachycardia at 125 bpm, QTc 482 ms Blood culture collected.   Patient was started on IV Rocephin and IV azithromycin.  Subjective: Patient was seen and examined this morning. Young African-American female. Much more awake and active.  No fever. Feels ready to be discharged today.  Hospital course: Severe sepsis POA  Bibasilar pneumonia Young homeless female presented with shortness of breath, cough, chills In the ED, patient had fever of 101, tachycardia, leukocytosis and altered mental status Chest x-ray with bibasilar pneumonia Blood culture did not show any growth.. Currently improving IV Rocephin and azithromycin.   No fever.  WBC count was higher yesterday.  Patient refused blood work today. Clinically improving  and feels ready to go. Plan to discharge on 5 more days of oral Augmentin. Continue Mucinex, as needed bronchodilators, incentive spirometry strep pneumoniae, Legionella urine antigen and mycoplasma antibody negative.  HIV test non reactive Recent Labs  Lab 12/10/22 0457 12/10/22 0459 12/10/22 0504 12/11/22 0510  WBC  --  14.1*  --  20.9*  LATICACIDVEN  --   --  1.4 0.8  PROCALCITON <0.10  --   --   --    Acute metabolic encephalopathy Patient was significantly altered at the time of my evaluation.   Likely because of sepsis as well as drugs. Mental status back to normal  Hypokalemia Potassium level was low. Replacement was given earlier. Continue to monitor. Recent Labs  Lab 12/10/22 0459 12/11/22 0510  K 3.1* 3.5   Polysubstance abuse (tobacco, alcohol, cocaine) UDS positive for cocaine and THC.  Watch out for alcohol withdrawal symptoms.  Continue CIWA protocol Counseled to quit.  Wounds:  -    Discharge Exam:   Vitals:   12/11/22 1203 12/11/22 1518 12/11/22 2011 12/12/22 0741  BP: 126/81 116/74 112/77 100/66  Pulse: (!) 108 (!) 105 (!) 106 81  Resp:  18 18 18   Temp: 97.7 F (36.5 C) 98.8 F (37.1 C) 98.6 F (37 C) 98.6 F (37 C)  TempSrc: Oral Oral Oral Oral  SpO2: 96% 98% 100% 95%  Weight:      Height:        Body mass index is 22.62 kg/m.   General exam: Young African-American female.  Not in pain. Skin: No rashes, lesions or ulcers. HEENT: Atraumatic, normocephalic, no obvious bleeding Lungs: Clear to auscultation bilaterally.  Coughs on deep breathing but improving overall..  Right-sided chest pain improved. CVS: Sinus tachycardia, regular rhythm, no  murmur GI/Abd soft, nontender, nondistended, bowel sound present CNS: Alert, awake, oriented x 3 Psychiatry: Sad affect Extremities: No pedal edema, no calf tenderness  Follow ups:    Follow-up Information     [facility] Follow up.   Why: Please call the  clinic and schedule a hospital follow up in the next 7-10 days. Contact information: [address] [place] [address] [phone]        [facility] Follow up.   Contact information: [address] [place] [address] [phone]                Discharge Instructions:   Discharge Instructions     Call MD for:  difficulty breathing, headache or visual disturbances   Complete by: As directed    Call MD for:  extreme fatigue   Complete by: As directed    Call MD for:  hives   Complete by: As directed    Call MD for:  persistant dizziness or light-headedness   Complete by: As directed    Call MD for:  persistant nausea and vomiting   Complete by: As directed    Call MD for:  severe uncontrolled pain   Complete by: As directed    Call MD for:  temperature >100.4   Complete by: As directed    Diet general   Complete by: As directed    Discharge instructions   Complete by: As directed    Recommendations at discharge:   Complete the course of antibiotics  General discharge instructions: Follow with Primary MD Centricity, User, MD (Inactive) in 7 days  Please request your PCP  to go over your hospital tests, procedures, radiology results at the follow up. Please get your medicines reviewed and adjusted.  Your PCP may decide to repeat certain labs or tests as needed. Do not drive, operate heavy machinery, perform activities at heights, swimming or participation in water activities or provide baby sitting services if your were admitted for syncope or siezures until you have seen by Primary MD or a Neurologist and advised to do so again. [place] Controlled Substance Reporting System database was reviewed. Do not drive, operate heavy machinery, perform activities at heights, swim, participate in water activities or provide baby-sitting services while on medications for pain,  sleep and mood until your outpatient physician has reevaluated you and advised to do so again.  You are strongly recommended to comply with the dose, frequency and duration of prescribed medications. Activity: As tolerated with Full fall precautions use walker/cane & assistance as needed Avoid using any recreational substances like cigarette, tobacco, alcohol, or non-prescribed drug. If you experience worsening of your admission symptoms, develop shortness of breath, life threatening emergency, suicidal or homicidal thoughts you must seek medical attention immediately by calling 911 or calling your MD immediately  if symptoms less severe. You must read complete instructions/literature along with all the possible adverse reactions/side effects for all the medicines you take and that have been prescribed to you. Take any new medicine only after you have completely understood and accepted all the possible adverse reactions/side effects.  Wear Seat belts while driving. You were cared for by a hospitalist during your hospital stay. If you have any questions about your discharge medications or the care you received while you were in the hospital after you are discharged, you can call the unit and ask to speak with the  hospitalist or the covering physician. Once you are discharged, your primary care physician will handle any further medical issues. Please note that NO REFILLS for any discharge medications will be authorized once you are discharged, as it is imperative that you return to your primary care physician (or establish a relationship with a primary care physician if you do not have one).   Increase activity slowly   Complete by: As directed        Discharge Medications:   Allergies as of 12/12/2022   No Known Allergies      Medication List     TAKE these medications    amoxicillin-clavulanate 875-125 MG tablet Commonly known as: AUGMENTIN Take 1 tablet by mouth every 12 (twelve) hours  for 5 days.   guaiFENesin 600 MG 12 hr tablet Commonly known as: MUCINEX Take 1 tablet (600 mg total) by mouth 2 (two) times daily for 7 days.         The results of significant diagnostics from this hospitalization (including imaging, microbiology, ancillary and laboratory) are listed below for reference.    Procedures and Diagnostic Studies:   DG Chest 2 View  Result Date: 12/10/2022 CLINICAL DATA:  Suspected sepsis EXAM: CHEST - 2 VIEW COMPARISON:  07/24/2022 FINDINGS: Airspace disease at the right more than left base, partially obscuring the right heart border. Normal heart size and mediastinal contours. No effusion or pneumothorax. Artifact from EKG leads. IMPRESSION: Pneumonia at the lung bases, most extensive in the right middle lobe. Electronically Signed   By: Tiburcio Pea M.D.   On: 12/10/2022 06:07     Labs:   Basic Metabolic Panel: Recent Labs  Lab 12/10/22 0459 12/11/22 0510  NA 137 137  K 3.1* 3.5  CL 101 103  CO2 23 23  GLUCOSE 93 122*  BUN 7 10  CREATININE 0.83 0.82  CALCIUM 9.4 8.8*   GFR Estimated Creatinine Clearance: 64.7 mL/min (by C-G formula based on SCr of 0.82 mg/dL). Liver Function Tests: Recent Labs  Lab 12/10/22 0459  AST 27  ALT 20  ALKPHOS 72  BILITOT 0.5  PROT 7.7  ALBUMIN 4.2   No results for input(s): "LIPASE", "AMYLASE" in the last 168 hours. No results for input(s): "AMMONIA" in the last 168 hours. Coagulation profile Recent Labs  Lab 12/10/22 0459  INR 0.9    CBC: Recent Labs  Lab 12/10/22 0459 12/11/22 0510  WBC 14.1* 20.9*  NEUTROABS 12.0*  --   HGB 14.5 12.4  HCT 43.3 36.8  MCV 89.1 89.8  PLT 427* 315   Cardiac Enzymes: No results for input(s): "CKTOTAL", "CKMB", "CKMBINDEX", "TROPONINI" in the last 168 hours. BNP: Invalid input(s): "POCBNP" CBG: No results for input(s): "GLUCAP" in the last 168 hours. D-Dimer No results for input(s): "DDIMER" in the last 72 hours. Hgb A1c No results for  input(s): "HGBA1C" in the last 72 hours. Lipid Profile No results for input(s): "CHOL", "HDL", "LDLCALC", "TRIG", "CHOLHDL", "LDLDIRECT" in the last 72 hours. Thyroid function studies No results for input(s): "TSH", "T4TOTAL", "T3FREE", "THYROIDAB" in the last 72 hours.  Invalid input(s): "FREET3" Anemia work up No results for input(s): "VITAMINB12", "FOLATE", "FERRITIN", "TIBC", "IRON", "RETICCTPCT" in the last 72 hours. Microbiology Recent Results (from the past 240 hour(s))  Culture, blood (Routine x 2)     Status: None (Preliminary result)   Collection Time: 12/10/22  4:59 AM   Specimen: BLOOD  Result Value Ref Range Status   Specimen Description BLOOD BLOOD RIGHT ARM  Final  Special Requests   Final    BOTTLES DRAWN AEROBIC AND ANAEROBIC Blood Culture adequate volume   Culture   Final    NO GROWTH 2 DAYS Performed at [facility], [address] [place], [place] [address]    Report Status PENDING  Incomplete  Culture, blood (Routine x 2)     Status: None (Preliminary result)   Collection Time: 12/10/22  4:59 AM   Specimen: BLOOD  Result Value Ref Range Status   Specimen Description BLOOD BLOOD LEFT ARM  Final   Special Requests   Final    BOTTLES DRAWN AEROBIC AND ANAEROBIC Blood Culture adequate volume   Culture   Final    NO GROWTH 2 DAYS Performed at [facility], [address], [place], [place] [address]    Report Status PENDING  Incomplete  Resp panel by RT-PCR (RSV, Flu A&B, Covid) Anterior Nasal Swab     Status: None   Collection Time: 12/10/22  5:33 AM   Specimen: Anterior Nasal Swab  Result Value Ref Range Status   SARS Coronavirus 2 by RT PCR NEGATIVE NEGATIVE Final   Influenza A by PCR NEGATIVE NEGATIVE Final   Influenza B by PCR NEGATIVE NEGATIVE Final    Comment: (NOTE) The Xpert Xpress SARS-CoV-2/FLU/RSV plus assay is intended as an aid in the diagnosis of influenza from Nasopharyngeal swab specimens and should not be used as a sole basis  for treatment. Nasal washings and aspirates are unacceptable for Xpert Xpress SARS-CoV-2/FLU/RSV testing.  Fact Sheet for Patients: BloggerCourse.com  Fact Sheet for Healthcare Providers: SeriousBroker.it  This test is not yet approved or cleared by the [place] FDA and has been authorized for detection and/or diagnosis of SARS-CoV-2 by FDA under an Emergency Use Authorization (EUA). This EUA will remain in effect (meaning this test can be used) for the duration of the COVID-19 declaration under Section 564(b)(1) of the Act, 21 U.S.C. section 360bbb-3(b)(1), unless the authorization is terminated or revoked.     Resp Syncytial Virus by PCR NEGATIVE NEGATIVE Final    Comment: (NOTE) Fact Sheet for Patients: BloggerCourse.com  Fact Sheet for Healthcare Providers: SeriousBroker.it  This test is not yet approved or cleared by the [place] FDA and has been authorized for detection and/or diagnosis of SARS-CoV-2 by FDA under an Emergency Use Authorization (EUA). This EUA will remain in effect (meaning this test can be used) for the duration of the COVID-19 declaration under Section 564(b)(1) of the Act, 21 U.S.C. section 360bbb-3(b)(1), unless the authorization is terminated or revoked.  Performed at [facility], [address] [place], [place] [address]   MRSA Next Gen by PCR, Nasal     Status: Abnormal   Collection Time: 12/11/22 10:17 AM   Specimen: Nasal Mucosa; Nasal Swab  Result Value Ref Range Status   MRSA by PCR Next Gen DETECTED (A) NOT DETECTED Final    Comment: RESULT CALLED TO, READ BACK BY AND VERIFIED WITH: RN EMY.E AT 1322 ON 12/11/2022 BY T.SAAD. (NOTE) The GeneXpert MRSA Assay (FDA approved for NASAL specimens only), is one component of a comprehensive MRSA colonization surveillance program. It is not intended to diagnose MRSA infection nor to  guide or monitor treatment for MRSA infections. Test performance is not FDA approved in patients less than 70 years old. Performed at [facility], [address], [place], [place] [address]     Time coordinating discharge: 45 minutes  Signed: Melina Schools Kyra Laffey  [facility] 12/12/2022, 12:55 PM

## 2022-12-12 NOTE — Plan of Care (Signed)

## 2022-12-14 NOTE — Plan of Care (Signed)
[facility] Tonsillectomy/Adenoidectomy, Postoperative PEDS care plan entered in error.

## 2022-12-15 LAB — CULTURE, BLOOD (ROUTINE X 2)
Culture: NO GROWTH
Culture: NO GROWTH
Special Requests: ADEQUATE
Special Requests: ADEQUATE

## 2023-06-09 IMAGING — CT CT HEAD W/O CM
3 of 4 series · 13 of 47 positions shown, 15 images · non-contrast
Comparison: None.

CLINICAL DATA: Recent alcohol and crack cocaine use with altered
mental status



[Series 3: head without · axial · non-contrast · 0.39mm/px · z∈[-149,-29]mm · 7 of 32 slices shown, 9 images]
[im 4/32  brain]
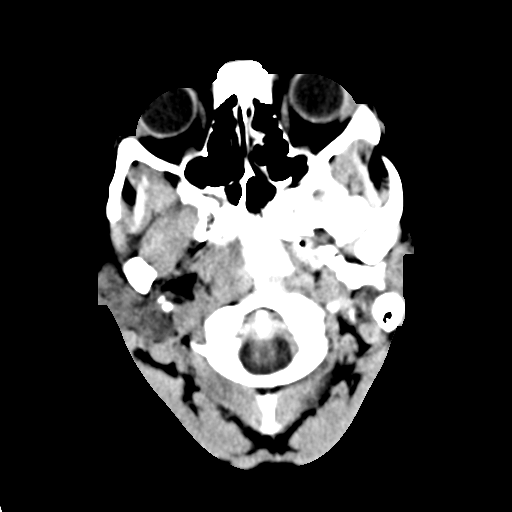
[im 4/32  bone]
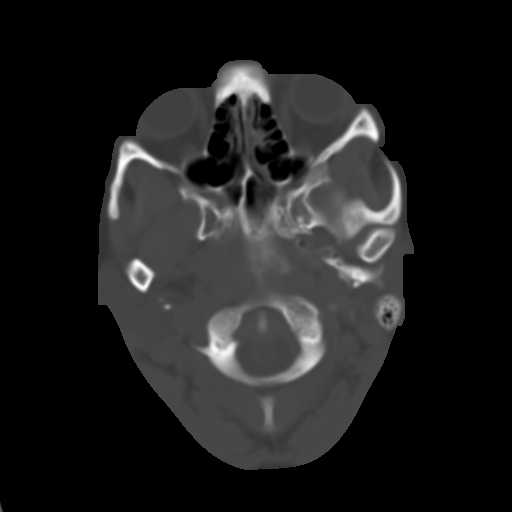
[im 8/32  brain]
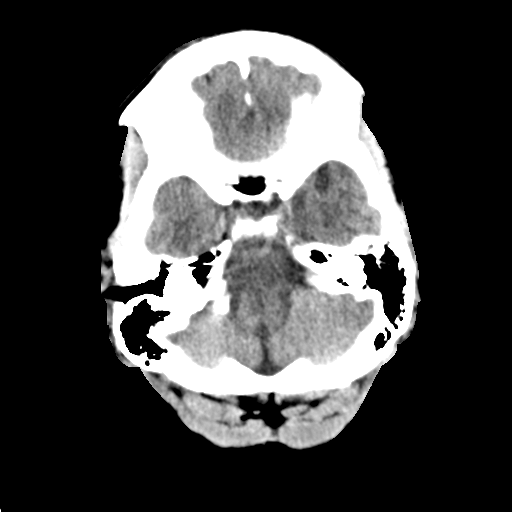
[im 12/32  brain]
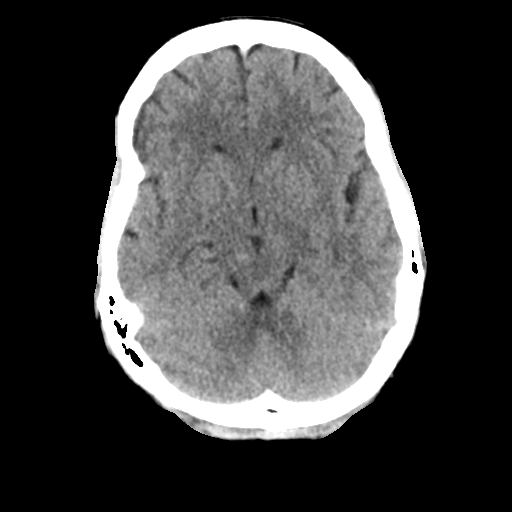
[im 16/32  brain]
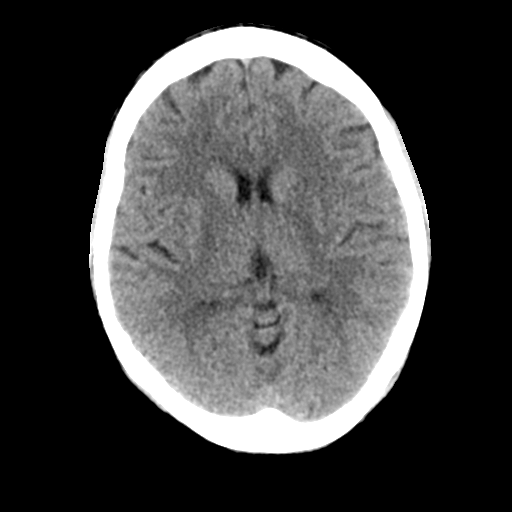
[im 20/32  brain]
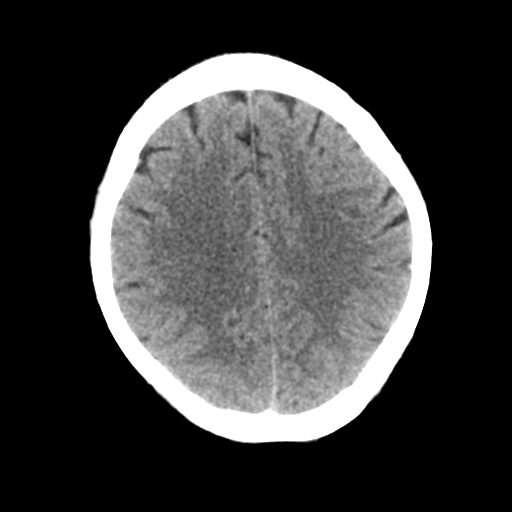
[im 20/32  bone]
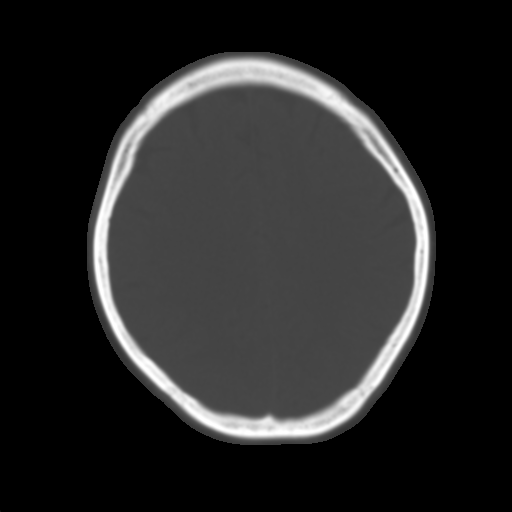
[im 24/32  brain]
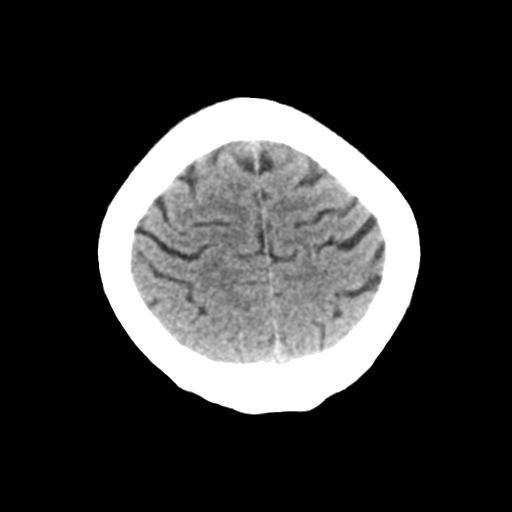
[im 28/32  brain]
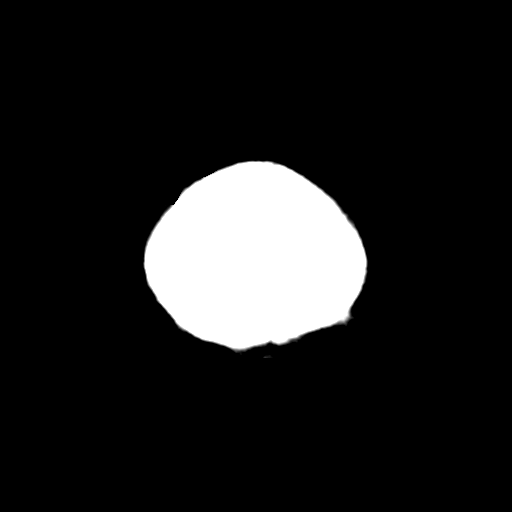

[Series 5: head without cor · coronal · non-contrast · 0.31mm/px · 3 of 67 slices shown]
[im 23/67  brain]
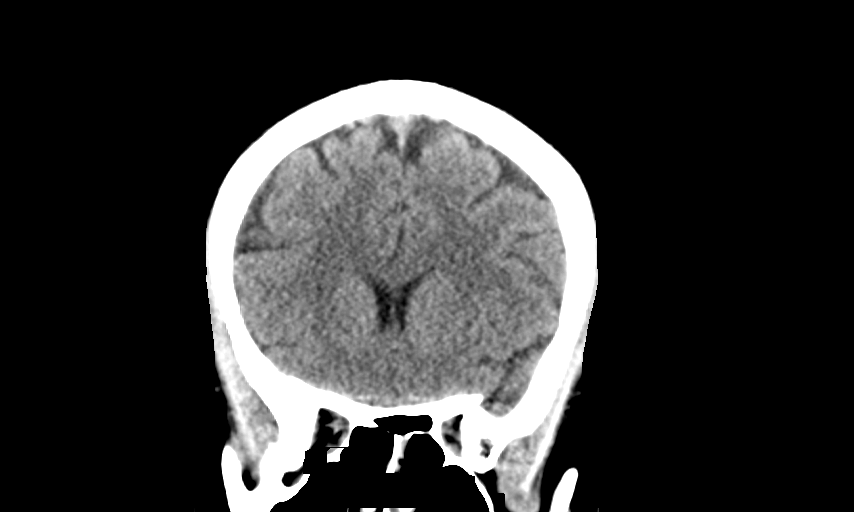
[im 30/67  brain]
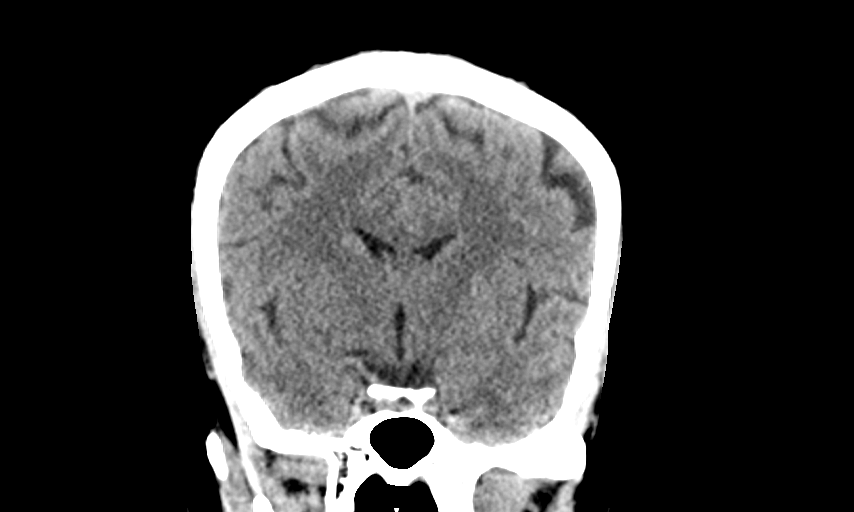
[im 37/67  brain]
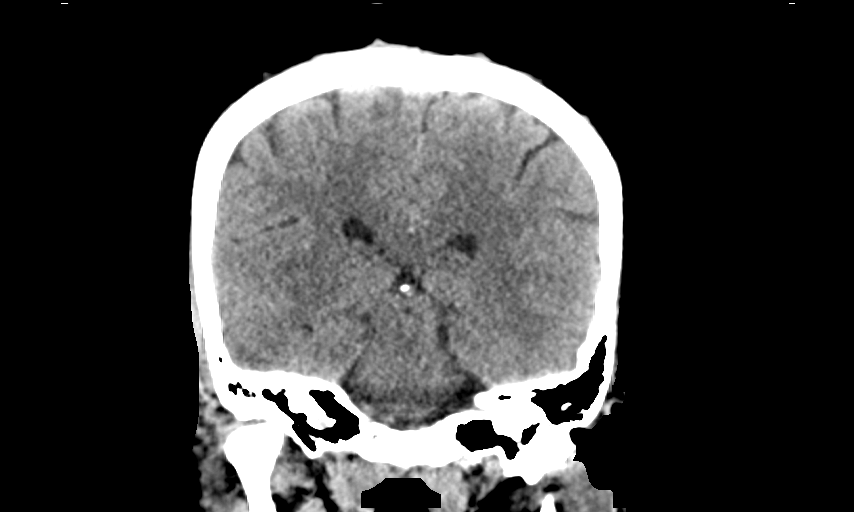

[Series 6: head without sag · sagittal · non-contrast · 0.31mm/px · 3 of 67 slices shown]
[im 23/67  brain]
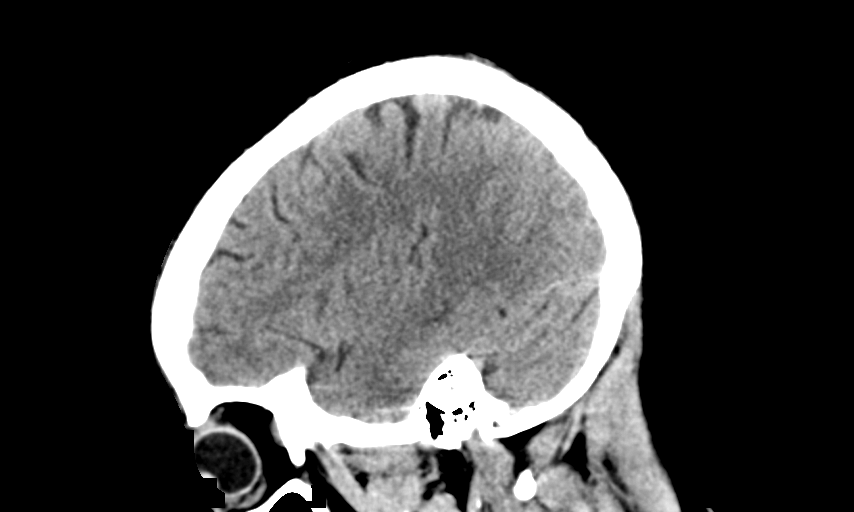
[im 34/67  brain]
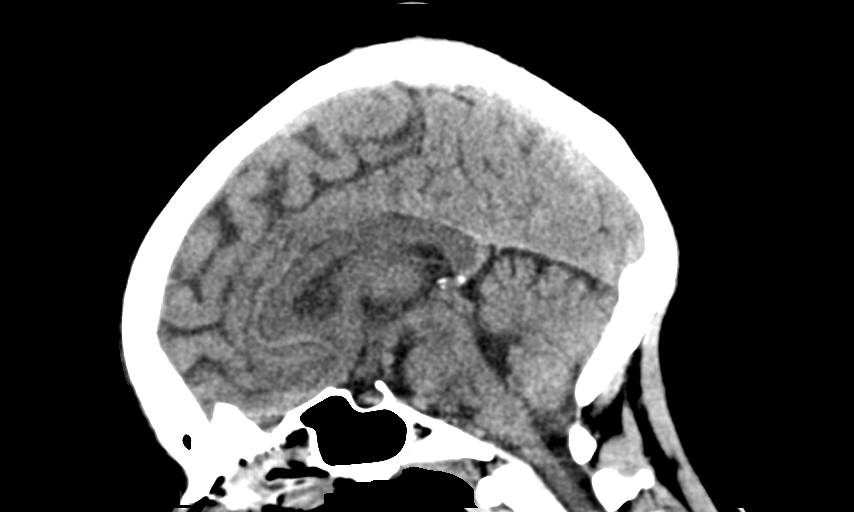
[im 45/67  brain]
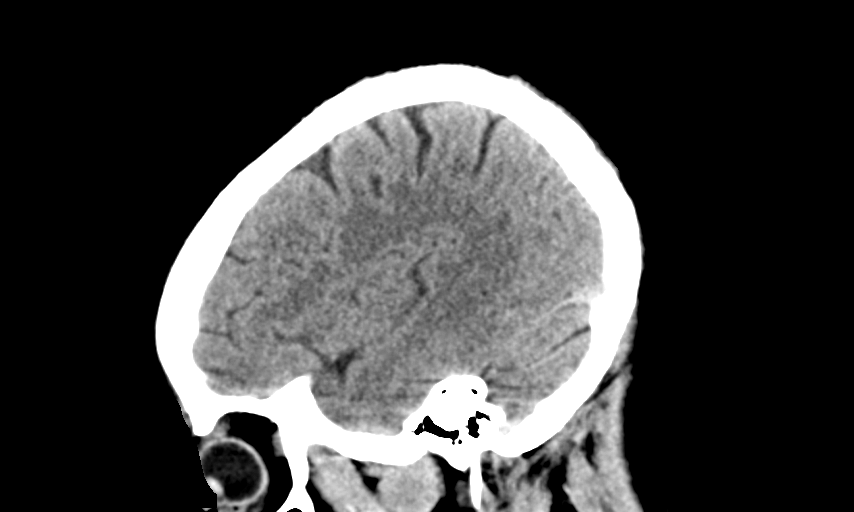

[13 of 47 positions shown; findings below may reference images not displayed]

FINDINGS: Brain: No evidence of acute infarction, hemorrhage, hydrocephalus,
extra-axial collection or mass lesion/mass effect.

Vascular: No hyperdense vessel or unexpected calcification.

Skull: Normal. Negative for fracture or focal lesion.

Sinuses/Orbits: No acute finding.

Other: None.
IMPRESSION: No acute intracranial abnormality noted.

## 2023-11-25 ENCOUNTER — Emergency Department (HOSPITAL_COMMUNITY)
Admission: EM | Admit: 2023-11-25 | Discharge: 2023-11-26 | Disposition: A | Discharge status: Home / Self Care | Payer: Self-pay | Attending: Emergency Medicine | Admitting: Emergency Medicine

## 2023-11-25 ENCOUNTER — Other Ambulatory Visit: Payer: Self-pay

## 2023-11-25 DIAGNOSIS — F10920 Alcohol use, unspecified with intoxication, uncomplicated: Secondary | ICD-10-CM | POA: Insufficient documentation

## 2023-11-25 DIAGNOSIS — L03012 Cellulitis of left finger: Secondary | ICD-10-CM | POA: Insufficient documentation

## 2023-11-25 DIAGNOSIS — Z79899 Other long term (current) drug therapy: Secondary | ICD-10-CM | POA: Insufficient documentation

## 2023-11-25 DIAGNOSIS — Y906 Blood alcohol level of 120-199 mg/100 ml: Secondary | ICD-10-CM | POA: Insufficient documentation

## 2023-11-25 LAB — CBC WITH DIFFERENTIAL/PLATELET
Abs Immature Granulocytes: 0.02 K/uL (ref 0.00–0.07)
Basophils Absolute: 0.1 K/uL (ref 0.0–0.1)
Basophils Relative: 1 %
Eosinophils Absolute: 0.1 K/uL (ref 0.0–0.5)
Eosinophils Relative: 0 %
HCT: 40.6 % (ref 36.0–46.0)
Hemoglobin: 13.3 g/dL (ref 12.0–15.0)
Immature Granulocytes: 0 %
Lymphocytes Relative: 52 %
Lymphs Abs: 6.3 K/uL — ABNORMAL HIGH (ref 0.7–4.0)
MCH: 30.4 pg (ref 26.0–34.0)
MCHC: 32.8 g/dL (ref 30.0–36.0)
MCV: 92.7 fL (ref 80.0–100.0)
Monocytes Absolute: 0.8 K/uL (ref 0.1–1.0)
Monocytes Relative: 7 %
Neutro Abs: 4.8 K/uL (ref 1.7–7.7)
Neutrophils Relative %: 40 %
Platelets: 415 K/uL — ABNORMAL HIGH (ref 150–400)
RBC: 4.38 MIL/uL (ref 3.87–5.11)
RDW: 13.3 % (ref 11.5–15.5)
WBC: 12 K/uL — ABNORMAL HIGH (ref 4.0–10.5)
nRBC: 0 % (ref 0.0–0.2)

## 2023-11-25 MED ORDER — ONDANSETRON 4 MG PO TBDP
4.0000 mg | ORAL_TABLET | Freq: Once | ORAL | Status: AC
  Administered 2023-11-25: 4 mg via ORAL
  Filled 2023-11-25: qty 1

## 2023-11-25 NOTE — ED Triage Notes (Signed)
 Coming from home after taking 2 little white pills earlier for pain but unsure what they were.   Friend on scene said she became diaphoretic so she called EMS. +etOH. Pt not diaphoretic on their arrival. Pt fidgety. Denies SI/HI.

## 2023-11-25 NOTE — ED Provider Triage Note (Signed)
 Emergency Medicine Provider Triage Evaluation Note  [name] , a 38 y.o. female  was evaluated in triage.  Pt complains of ingestion. Apparently took 2 white pills this evening, also has been drinking alcohol this evening as well. She is unsure what the pills were, reports she took them for finger pain. Denies any complaints at this time.   Review of Systems  Positive:  Negative:   Physical Exam  BP 122/88   Pulse 91   Temp 97.7 F (36.5 C)   Resp 16   Ht 4' 11 (1.499 m)   Wt 50 kg   SpO2 100%   BMI 22.26 kg/m  Gen:   Awake, no distress   Resp:  Normal effort  MSK:   Moves extremities without difficulty  Other:  Appears intoxicated  Medical Decision Making  Medically screening exam initiated at 11:11 PM.  Appropriate orders placed.  [name] was informed that the remainder of the evaluation will be completed by another provider, this initial triage assessment does not replace that evaluation, and the importance of remaining in the ED until their evaluation is complete.     Nora Lauraine LABOR, PA-C 11/25/23 2314

## 2023-11-26 ENCOUNTER — Emergency Department (HOSPITAL_COMMUNITY): Payer: Self-pay

## 2023-11-26 LAB — COMPREHENSIVE METABOLIC PANEL WITH GFR
ALT: 23 U/L (ref 0–44)
AST: 28 U/L (ref 15–41)
Albumin: 4 g/dL (ref 3.5–5.0)
Alkaline Phosphatase: 53 U/L (ref 38–126)
Anion gap: 14 (ref 5–15)
BUN: 11 mg/dL (ref 6–20)
CO2: 20 mmol/L — ABNORMAL LOW (ref 22–32)
Calcium: 8.7 mg/dL — ABNORMAL LOW (ref 8.9–10.3)
Chloride: 103 mmol/L (ref 98–111)
Creatinine, Ser: 0.92 mg/dL (ref 0.44–1.00)
GFR, Estimated: >60 mL/min (ref >60)
Glucose, Bld: 95 mg/dL (ref 70–99)
Potassium: 3 mmol/L — ABNORMAL LOW (ref 3.5–5.1)
Sodium: 137 mmol/L (ref 135–145)
Total Bilirubin: 0.7 mg/dL (ref 0.0–1.2)
Total Protein: 7 g/dL (ref 6.5–8.1)

## 2023-11-26 LAB — ETHANOL: Alcohol, Ethyl (B): 164 mg/dL — ABNORMAL HIGH (ref <15)

## 2023-11-26 LAB — RAPID URINE DRUG SCREEN, HOSP PERFORMED
Amphetamines: NOT DETECTED
Barbiturates: NOT DETECTED
Benzodiazepines: NOT DETECTED
Cocaine: POSITIVE — AB
Opiates: NOT DETECTED
Tetrahydrocannabinol: NOT DETECTED

## 2023-11-26 LAB — HCG, SERUM, QUALITATIVE: Preg, Serum: NEGATIVE

## 2023-11-26 LAB — ACETAMINOPHEN LEVEL: Acetaminophen (Tylenol), Serum: <10 ug/mL — ABNORMAL LOW (ref 10–30)

## 2023-11-26 LAB — SALICYLATE LEVEL: Salicylate Lvl: <7 mg/dL — ABNORMAL LOW (ref 7.0–30.0)

## 2023-11-26 MED ORDER — CEPHALEXIN 500 MG PO CAPS: 500.0000 mg | ORAL_CAPSULE | Freq: Three times a day (TID) | ORAL | 0 refills | Status: AC

## 2023-11-26 MED ORDER — DOXYCYCLINE HYCLATE 100 MG PO CAPS: 100.0000 mg | ORAL_CAPSULE | Freq: Two times a day (BID) | ORAL | 0 refills | Status: AC

## 2023-11-26 MED ORDER — CEPHALEXIN 250 MG PO CAPS
500.0000 mg | ORAL_CAPSULE | Freq: Once | ORAL | Status: AC
  Administered 2023-11-26: 500 mg via ORAL
  Filled 2023-11-26: qty 2

## 2023-11-26 MED ORDER — DOXYCYCLINE HYCLATE 100 MG PO TABS
100.0000 mg | ORAL_TABLET | Freq: Once | ORAL | Status: AC
  Administered 2023-11-26: 100 mg via ORAL
  Filled 2023-11-26: qty 1

## 2023-11-26 NOTE — ED Notes (Signed)
 Patient transported to X-ray

## 2023-11-26 NOTE — ED Provider Notes (Signed)
 [place] EMERGENCY DEPARTMENT AT [facility] Provider Note   CSN: [id number] Arrival date & time: 11/25/23  2247     History Chief Complaint  Patient presents with   Ingestion    HPI [name] is a 38 y.o. female presenting for chronic left hand pain.  States that it was hurting earlier today so she took 2 of her friend's pain pills.  Afterwards she got confused and diaphoretic so bystander called EMS. Patient had also had multiple alcoholic beverages prior to this event. She states that her left pinky finger has been swollen over the last month.  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 Denies fevers chills, nausea vomiting, syncope shortness of breath.  Patient's recorded medical, surgical, social, medication list and allergies were reviewed in the Snapshot window as part of the initial history.   Review of Systems   Review of Systems  Constitutional:  Negative for chills and fever.  HENT:  Negative for ear pain and sore throat.   Eyes:  Negative for pain and visual disturbance.  Respiratory:  Negative for cough and shortness of breath.   Cardiovascular:  Negative for chest pain and palpitations.  Gastrointestinal:  Negative for abdominal pain and vomiting.  Genitourinary:  Negative for dysuria and hematuria.  Musculoskeletal:  Negative for arthralgias and back pain.  Skin:  Negative for color change and rash.  Neurological:  Negative for seizures and syncope.  All other systems reviewed and are negative.   Physical Exam Updated Vital Signs BP 122/88   Pulse 91   Temp 97.7 F (36.5 C)   Resp 16   Ht 4' 11 (1.499 m)   Wt 50 kg   SpO2 100%   BMI 22.26 kg/m  Physical Exam Vitals and nursing note reviewed.  Constitutional:      General: She is not in acute distress.    Appearance: She is well-developed.  HENT:     Head: Normocephalic and atraumatic.  Eyes:     Conjunctiva/sclera: Conjunctivae  normal.  Cardiovascular:     Rate and Rhythm: Normal rate and regular rhythm.     Heart sounds: No murmur heard. Pulmonary:     Effort: Pulmonary effort is normal. No respiratory distress.     Breath sounds: Normal breath sounds.  Abdominal:     General: There is no distension.     Palpations: Abdomen is soft.     Tenderness: There is no abdominal tenderness. There is no right CVA tenderness or left CVA tenderness.  Musculoskeletal:        General: Swelling present. No tenderness. Normal range of motion.     Cervical back: Neck supple.     Comments: Very slight swelling of the middle phalanx of the left hand fifth digit. Uncertain if this is physiologic from her amputation. Patient feels like it is new but is uncertain. Notably she has no pain with flexion, pain with extension or pain with palpation of this area at this time.  Skin:    General: Skin is warm and dry.  Neurological:     General: No focal deficit present.     Mental Status: She is alert and oriented to person, place, and time. Mental status is at baseline.     Cranial Nerves: No cranial nerve deficit.      ED Course/ Medical Decision Making/ A&P    Procedures Procedures   Medications Ordered in ED Medications  doxycycline  (VIBRA -TABS) tablet 100  mg (has no administration in time range)  cephALEXin  (KEFLEX ) capsule 500 mg (has no administration in time range)  ondansetron  (ZOFRAN -ODT) disintegrating tablet 4 mg (4 mg Oral Given 11/25/23 2327)    Medical Decision Making:   38 year old female presenting with 2 complaints.  First she is complaining of possible overdose.  She was allegedly confused and sleepy after taking 2 pain pills from a friend.  History is currently consistent with narcotic overdose but patient has been able to metabolize in the ER.  Observed for 4 hours with restoration of normal mental status.  This is likely exacerbated by her concurrent alcohol use which she is also metabolizing appropriately  after 7-1/2 hours of observation in the emergency room she is ambulatory tolerating p.o. intake..  Patient was educated on importance of not using unlabeled medications and not taking other peoples prescription medications.  Additionally, patient with a secondary complaint of finger swelling.  Does not appear to be consistent with flexor tendon tenosynovitis at this time.  X-ray shows no visible osteomyelitis or immediate complication.  She is endorsing worsening swelling though it is subtle at this time.  Her white count has downtrended from her last sample but still slightly elevated at 12.  No visible abscess or palpable fluctuance.  Will treat for possible developing cellulitis with Keflex /Doxy and recommend reassessment with her primary outpatient hand surgery team within 48 hours.  Patient provided information for this care team and instructed return if not able to coordinate said care.   Clinical Impression:  1. Alcoholic intoxication without complication   2. Cellulitis of finger of left hand      Data Unavailable   Final Clinical Impression(s) / ED Diagnoses Final diagnoses:  Alcoholic intoxication without complication  Cellulitis of finger of left hand    Rx / DC Orders ED Discharge Orders          Ordered    cephALEXin  (KEFLEX ) 500 MG capsule  3 times daily        11/26/23 0310    doxycycline  (VIBRAMYCIN ) 100 MG capsule  2 times daily        11/26/23 0310              Jerral Meth, MD 11/26/23 [phone]

## 2023-11-26 NOTE — ED Notes (Signed)
 Pt resting, eyes closed. Breathing easy/unlabored. No distress noted.

## 2024-03-07 ENCOUNTER — Encounter (HOSPITAL_COMMUNITY): Payer: Self-pay

## 2024-03-07 ENCOUNTER — Emergency Department (HOSPITAL_COMMUNITY): Payer: Self-pay

## 2024-03-07 ENCOUNTER — Other Ambulatory Visit: Payer: Self-pay

## 2024-03-07 ENCOUNTER — Emergency Department (HOSPITAL_COMMUNITY)
Admission: EM | Admit: 2024-03-07 | Discharge: 2024-03-07 | Discharge status: Against Medical Advice | Payer: Self-pay | Attending: Emergency Medicine | Admitting: Emergency Medicine

## 2024-03-07 DIAGNOSIS — R0602 Shortness of breath: Secondary | ICD-10-CM | POA: Insufficient documentation

## 2024-03-07 DIAGNOSIS — R0981 Nasal congestion: Secondary | ICD-10-CM | POA: Insufficient documentation

## 2024-03-07 DIAGNOSIS — R059 Cough, unspecified: Secondary | ICD-10-CM | POA: Insufficient documentation

## 2024-03-07 DIAGNOSIS — Z5329 Procedure and treatment not carried out because of patient's decision for other reasons: Secondary | ICD-10-CM | POA: Insufficient documentation

## 2024-03-07 LAB — CBC WITH DIFFERENTIAL/PLATELET
Abs Immature Granulocytes: 0.08 K/uL — ABNORMAL HIGH (ref 0.00–0.07)
Basophils Absolute: 0 K/uL (ref 0.0–0.1)
Basophils Relative: 0 %
Eosinophils Absolute: 0.1 K/uL (ref 0.0–0.5)
Eosinophils Relative: 0 %
HCT: 36.8 % (ref 36.0–46.0)
Hemoglobin: 12 g/dL (ref 12.0–15.0)
Immature Granulocytes: 1 %
Lymphocytes Relative: 14 %
Lymphs Abs: 2.3 K/uL (ref 0.7–4.0)
MCH: 30.7 pg (ref 26.0–34.0)
MCHC: 32.6 g/dL (ref 30.0–36.0)
MCV: 94.1 fL (ref 80.0–100.0)
Monocytes Absolute: 1.5 K/uL — ABNORMAL HIGH (ref 0.1–1.0)
Monocytes Relative: 9 %
Neutro Abs: 12.4 K/uL — ABNORMAL HIGH (ref 1.7–7.7)
Neutrophils Relative %: 76 %
Platelets: 448 K/uL — ABNORMAL HIGH (ref 150–400)
RBC: 3.91 MIL/uL (ref 3.87–5.11)
RDW: 13 % (ref 11.5–15.5)
WBC: 16.4 K/uL — ABNORMAL HIGH (ref 4.0–10.5)
nRBC: 0 % (ref 0.0–0.2)

## 2024-03-07 LAB — BASIC METABOLIC PANEL WITH GFR
Anion gap: 10 (ref 5–15)
BUN: 8 mg/dL (ref 6–20)
CO2: 25 mmol/L (ref 22–32)
Calcium: 8.9 mg/dL (ref 8.9–10.3)
Chloride: 106 mmol/L (ref 98–111)
Creatinine, Ser: 0.81 mg/dL (ref 0.44–1.00)
GFR, Estimated: >60 mL/min
Glucose, Bld: 114 mg/dL — ABNORMAL HIGH (ref 70–99)
Potassium: 3.3 mmol/L — ABNORMAL LOW (ref 3.5–5.1)
Sodium: 142 mmol/L (ref 135–145)

## 2024-03-07 LAB — RESP PANEL BY RT-PCR (RSV, FLU A&B, COVID)  RVPGX2
Influenza A by PCR: NEGATIVE
Influenza B by PCR: NEGATIVE
Resp Syncytial Virus by PCR: NEGATIVE
SARS Coronavirus 2 by RT PCR: NEGATIVE

## 2024-03-07 NOTE — ED Provider Triage Note (Signed)
 Emergency Medicine Provider Triage Evaluation Note  Annabella LITTIE Acton , a 39 y.o. female  was evaluated in triage.  Pt complains of cough, congestion. Feels similar to previous PNA. Also vag bleeding w dizziness/fatigue  Review of Systems  Positive: Cough, congestion, fever, vaginal bleeding, fatigue Negative: N/V/D, abd pain  Physical Exam  BP (!) 137/95   Pulse 99   Temp 99.2 F (37.3 C)   Resp 12   Ht 4' 11 (1.499 m)   Wt 59 kg   LMP 02/26/2024   SpO2 97%   BMI 26.26 kg/m  Gen:   Awake, no distress, ill appearing   Resp:  Normal effort  MSK:   Moves extremities without difficulty  Other:    Medical Decision Making  Medically screening exam initiated at 11:37 AM.  Appropriate orders placed.  Anasofia L Acton was informed that the remainder of the evaluation will be completed by another provider, this initial triage assessment does not replace that evaluation, and the importance of remaining in the ED until their evaluation is complete.  Labs and imaging ordered   Francis Ileana LOISE DEVONNA 03/07/24 1138

## 2024-03-07 NOTE — ED Notes (Signed)
 Pt seen leaving ED lobby and getting into car

## 2024-03-07 NOTE — ED Triage Notes (Addendum)
 Pt states she has pneumonia, c/o right sided chest pain, shob, cough, and fevers for 2 days. Denies nausea/vomiting. Pt also states she's been having vaginal bleeding since the 1st, states she is dizzy. Pt states changed pad 3 times a day.
